# Patient Record
Sex: Female | Born: 1950 | Race: Black or African American | Hispanic: No | Marital: Single | Smoking: Never smoker
Health system: Southern US, Community
[De-identification: ages and names within clinical notes are randomized; demographics above are authoritative.]

## PROBLEM LIST (undated history)

## (undated) DIAGNOSIS — M810 Age-related osteoporosis without current pathological fracture: Secondary | ICD-10-CM

## (undated) DIAGNOSIS — Z923 Personal history of irradiation: Secondary | ICD-10-CM

## (undated) DIAGNOSIS — F419 Anxiety disorder, unspecified: Secondary | ICD-10-CM

## (undated) DIAGNOSIS — C50919 Malignant neoplasm of unspecified site of unspecified female breast: Secondary | ICD-10-CM

## (undated) DIAGNOSIS — E785 Hyperlipidemia, unspecified: Secondary | ICD-10-CM

## (undated) HISTORY — DX: Malignant neoplasm of unspecified site of unspecified female breast: C50.919

## (undated) HISTORY — DX: Hyperlipidemia, unspecified: E78.5

## (undated) HISTORY — DX: Age-related osteoporosis without current pathological fracture: M81.0

## (undated) HISTORY — PX: BREAST BIOPSY: SHX20

---

## 2001-04-15 ENCOUNTER — Inpatient Hospital Stay (HOSPITAL_COMMUNITY): Admission: AD | Admit: 2001-04-15 | Discharge: 2001-04-15 | Payer: Self-pay | Admitting: *Deleted

## 2003-05-02 ENCOUNTER — Other Ambulatory Visit: Payer: Self-pay

## 2004-06-14 ENCOUNTER — Ambulatory Visit: Payer: Self-pay

## 2004-06-20 ENCOUNTER — Ambulatory Visit: Payer: Self-pay

## 2007-01-06 ENCOUNTER — Ambulatory Visit: Payer: Self-pay

## 2007-07-31 ENCOUNTER — Emergency Department (HOSPITAL_COMMUNITY): Admission: EM | Admit: 2007-07-31 | Discharge: 2007-07-31 | Payer: Self-pay | Admitting: Family Medicine

## 2008-03-01 ENCOUNTER — Ambulatory Visit: Payer: Self-pay

## 2010-03-12 ENCOUNTER — Ambulatory Visit: Payer: Self-pay

## 2010-09-10 ENCOUNTER — Ambulatory Visit: Payer: Self-pay

## 2012-07-22 ENCOUNTER — Telehealth (HOSPITAL_COMMUNITY): Payer: Self-pay | Admitting: *Deleted

## 2012-07-22 NOTE — Telephone Encounter (Signed)
Telephoned patient at mobile # and left message to return call to [facility] 

## 2013-03-22 ENCOUNTER — Encounter (HOSPITAL_COMMUNITY): Payer: Self-pay | Admitting: Emergency Medicine

## 2013-03-22 ENCOUNTER — Emergency Department (HOSPITAL_COMMUNITY)
Admission: EM | Admit: 2013-03-22 | Discharge: 2013-03-22 | Disposition: A | Discharge status: Home / Self Care | Payer: Self-pay | Source: Home / Self Care

## 2013-03-22 DIAGNOSIS — J302 Other seasonal allergic rhinitis: Secondary | ICD-10-CM

## 2013-03-22 MED ORDER — FLUTICASONE PROPIONATE 50 MCG/ACT NA SUSP: 2.0000 | Freq: Two times a day (BID) | NASAL | Status: DC

## 2013-03-22 MED ORDER — FEXOFENADINE HCL 180 MG PO TABS: 180.0000 mg | ORAL_TABLET | Freq: Every day | ORAL | Status: DC

## 2013-03-22 NOTE — ED Notes (Signed)
C/o sinus issues which started Friday and progressively got worst.  OTC medication taken but no relief.

## 2013-03-22 NOTE — ED Provider Notes (Signed)
CSN: [id number]     Arrival date & time 03/22/13  1914 History   None    Chief Complaint  Patient presents with  . Facial Pain   (Consider location/radiation/quality/duration/timing/severity/associated sxs/prior Treatment) Patient is a 62 y.o. female presenting with URI. The history is provided by the patient.  URI Presenting symptoms: congestion and rhinorrhea   Presenting symptoms: no fever and no sore throat   Severity:  Mild Onset quality:  Gradual Duration:  4 days Progression:  Unchanged Chronicity:  New   History reviewed. No pertinent past medical history. No past surgical history on file. No family history on file. History  Substance Use Topics  . Smoking status: Not on file  . Smokeless tobacco: Not on file  . Alcohol Use: Not on file   OB History   Grav Para Term Preterm Abortions TAB SAB Ect Mult Living                 Review of Systems  Constitutional: Negative.  Negative for fever and chills.  HENT: Positive for congestion, nosebleeds and rhinorrhea. Negative for sore throat.   Respiratory: Negative.   Cardiovascular: Negative.     Allergies  Review of patient's allergies indicates no known allergies.  Home Medications  No current outpatient prescriptions on file. BP 147/87  Pulse 73  Temp(Src) 98.2 F (36.8 C) (Oral)  Resp 18  SpO2 99% Physical Exam  Nursing note and vitals reviewed. Constitutional: She is oriented to person, place, and time. She appears well-developed and well-nourished.  HENT:  Head: Normocephalic.  Right Ear: External ear normal.  Left Ear: External ear normal.  Nose: Mucosal edema and rhinorrhea present.  Mouth/Throat: Oropharynx is clear and moist.  Neck: Normal range of motion. Neck supple.  Cardiovascular: Normal rate, regular rhythm, normal heart sounds and intact distal pulses.   Pulmonary/Chest: Effort normal and breath sounds normal.  Lymphadenopathy:    She has no cervical adenopathy.  Neurological: She is  alert and oriented to person, place, and time.  Skin: Skin is warm and dry.    ED Course  Procedures (including critical care time) Labs Review Labs Reviewed - No data to display Imaging Review No results found.    MDM      Linna Hoff, MD 03/22/13 2044

## 2013-10-12 ENCOUNTER — Ambulatory Visit: Payer: Self-pay | Admitting: Family Medicine

## 2014-11-15 ENCOUNTER — Ambulatory Visit: Payer: Self-pay | Attending: Oncology

## 2014-11-15 ENCOUNTER — Ambulatory Visit
Admission: RE | Admit: 2014-11-15 | Discharge: 2014-11-15 | Disposition: A | Discharge status: Home / Self Care | Payer: Self-pay | Source: Ambulatory Visit | Attending: Oncology | Admitting: Oncology

## 2014-11-15 VITALS — BP 108/68 | HR 80 | Temp 96.0°F | Resp 20 | Ht 66.54 in | Wt 127.9 lb

## 2014-11-15 DIAGNOSIS — Z0000 Encounter for general adult medical examination without abnormal findings: Secondary | ICD-10-CM

## 2014-11-15 NOTE — Progress Notes (Signed)
Subjective:     Patient ID: Cheryl [name], female   DOB: Mar 04, 1951, 64 y.o.   MRN: [id number]  HPI   Review of Systems     Objective:   Physical Exam  Pulmonary/Chest: Right breast exhibits tenderness. Right breast exhibits no inverted nipple, no mass, no nipple discharge and no skin change. Left breast exhibits tenderness. Left breast exhibits no inverted nipple, no mass, no nipple discharge and no skin change. Breasts are symmetrical.  Bilateral generalized breast tenderness       Assessment:     Patient presents for [place] clinic visitPatient screened, and meets [facility] eligibility.  Patient does not have insurance, Medicare or Medicaid.  Handout given on Affordable Care Act.CBE unremarkable.  Patient reports generalized  tenderness bilateral on palpation. To try to limit caffeine to decrease breast tenderness. Instructed patient on breast self-exam using teach back method.      Plan:     Sent for bilateral screening mammogram.  Specimen collected for Pap.

## 2014-11-20 LAB — PAP LB AND HPV HIGH-RISK
HPV, high-risk: NEGATIVE
PAP Smear Comment: 0

## 2014-11-28 ENCOUNTER — Telehealth: Payer: Self-pay | Admitting: *Deleted

## 2014-11-28 NOTE — Progress Notes (Signed)
Patient notified of normal mammogram, and pap smear results.  Copy to [facility].

## 2014-12-27 NOTE — Telephone Encounter (Signed)
confirmed

## 2015-11-19 ENCOUNTER — Ambulatory Visit: Payer: Self-pay

## 2015-11-26 ENCOUNTER — Ambulatory Visit: Payer: Self-pay | Attending: Internal Medicine

## 2015-11-26 ENCOUNTER — Ambulatory Visit
Admission: RE | Admit: 2015-11-26 | Discharge: 2015-11-26 | Disposition: A | Discharge status: Home / Self Care | Payer: Self-pay | Source: Ambulatory Visit | Attending: Internal Medicine | Admitting: Internal Medicine

## 2015-11-26 VITALS — BP 138/70 | HR 72 | Temp 97.7°F | Resp 20

## 2015-11-26 DIAGNOSIS — Z0000 Encounter for general adult medical examination without abnormal findings: Secondary | ICD-10-CM

## 2015-11-26 NOTE — Progress Notes (Signed)
Subjective:     Patient ID: Cheryl [name], female   DOB: [birth year], 65 y.o.   MRN: [id number]  HPI   Review of Systems     Objective:   Physical Exam  Pulmonary/Chest: Right breast exhibits no inverted nipple, no mass, no nipple discharge, no skin change and no tenderness. Left breast exhibits no inverted nipple, no mass, no nipple discharge, no skin change and no tenderness. Breasts are symmetrical.       Assessment:     65 year old patient presents for [facility] clinic visit.  Patient screened, and meets [facility] eligibility.  Patient does not have insurance, Medicare or Medicaid.  Handout given on Affordable Care Act.  Instructed patient on breast self-exam using teach back method.  CBE unremarkable.  No mass or lump palpated.   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    Plan:     Sent for bilateral screening mammogram.

## 2015-12-10 NOTE — Progress Notes (Unsigned)
Letter mailed from [facility] to notify of normal mammogram results.  Patient to return in one year for annual screening.  Copy to [facility]. 

## 2017-02-18 ENCOUNTER — Ambulatory Visit: Payer: Self-pay | Attending: Oncology

## 2019-03-19 ENCOUNTER — Encounter: Payer: Self-pay | Admitting: Emergency Medicine

## 2019-03-19 ENCOUNTER — Ambulatory Visit
Admission: EM | Admit: 2019-03-19 | Discharge: 2019-03-19 | Disposition: A | Discharge status: Home / Self Care | Payer: Medicare Other | Attending: Family Medicine | Admitting: Family Medicine

## 2019-03-19 ENCOUNTER — Other Ambulatory Visit: Payer: Self-pay

## 2019-03-19 DIAGNOSIS — R202 Paresthesia of skin: Secondary | ICD-10-CM | POA: Diagnosis not present

## 2019-03-19 DIAGNOSIS — S161XXA Strain of muscle, fascia and tendon at neck level, initial encounter: Secondary | ICD-10-CM | POA: Diagnosis present

## 2019-03-19 DIAGNOSIS — M79602 Pain in left arm: Secondary | ICD-10-CM

## 2019-03-19 MED ORDER — CYCLOBENZAPRINE HCL 10 MG PO TABS: 10.0000 mg | ORAL_TABLET | Freq: Every day | ORAL | 0 refills | Status: DC

## 2019-03-19 NOTE — ED Provider Notes (Signed)
[facility]    CSN: [id number] Arrival date & time: 03/19/19  1512      History   Chief Complaint Chief Complaint  Patient presents with  . Extremity Weakness    HPI Cheryl [name] is a 68 y.o. female.   68 yo female with a c/o left arm pain and tingling for the past 2 weeks. Denies any falls or other injuries. Also c/o left upper back and neck pains. States symptoms are intermittent. Denies any chest pain, shortness of breath, vision changes, slurred speech, difficulty swallowing, jaw pain, unilateral weakness, headaches, rash. States she's been taking tylenol and advil with some relief.    Extremity Weakness    No past medical history on file.  There are no active problems to display for this patient.   No past surgical history on file.  OB History   No obstetric history on file.      Home Medications    Prior to Admission medications   Medication Sig Start Date End Date Taking? Authorizing Provider  cyclobenzaprine (FLEXERIL) 10 MG tablet Take 1 tablet (10 mg total) by mouth at bedtime. 03/19/19   Norval Gable, MD  fexofenadine (ALLEGRA) 180 MG tablet Take 1 tablet (180 mg total) by mouth daily. 03/22/13   Billy Fischer, MD  fluticasone (FLONASE) 50 MCG/ACT nasal spray Place 2 sprays into the nose 2 (two) times daily. 03/22/13   Billy Fischer, MD    Family History Family History  Problem Relation Age of Onset  . Breast cancer Mother 80  . Breast cancer Maternal Aunt 70  . Breast cancer Maternal Aunt 70    Social History Social History   Tobacco Use  . Smoking status: Not on file  Substance Use Topics  . Alcohol use: Not on file  . Drug use: Not on file     Allergies   Patient has no known allergies.   Review of Systems Review of Systems  Musculoskeletal: Positive for extremity weakness.     Physical Exam Triage Vital Signs ED Triage Vitals  Enc Vitals Group     BP 03/19/19 1531 (!) 178/93     Pulse Rate 03/19/19  1531 71     Resp 03/19/19 1531 18     Temp 03/19/19 1531 97.8 F (36.6 C)     Temp Source 03/19/19 1531 Oral     SpO2 03/19/19 1531 99 %     Weight 03/19/19 1529 128 lb (58.1 kg)     Height --      Head Circumference --      Peak Flow --      Pain Score 03/19/19 1526 5     Pain Loc --      Pain Edu? --      Excl. in [place]? --    No data found.  Updated Vital Signs BP (!) 178/93   Pulse 71   Temp 97.8 F (36.6 C) (Oral)   Resp 18   Wt 58.1 kg   SpO2 99%   BMI 20.33 kg/m   Visual Acuity Right Eye Distance:   Left Eye Distance:   Bilateral Distance:    Right Eye Near:   Left Eye Near:    Bilateral Near:     Physical Exam Vitals signs and nursing note reviewed.  Constitutional:      General: She is not in acute distress.    Appearance: She is not toxic-appearing or diaphoretic.  Eyes:     Extraocular  Movements: Extraocular movements intact.     Pupils: Pupils are equal, round, and reactive to light.  Cardiovascular:     Rate and Rhythm: Normal rate and regular rhythm.     Pulses: Normal pulses.     Heart sounds: Normal heart sounds.  Pulmonary:     Effort: Pulmonary effort is normal. No respiratory distress.     Breath sounds: Normal breath sounds. No stridor. No wheezing, rhonchi or rales.  Musculoskeletal:     Left shoulder: She exhibits tenderness (over the deltoid muscle). She exhibits normal range of motion, no bony tenderness, no swelling, no effusion, no crepitus, no deformity, no laceration, no spasm, normal pulse and normal strength.     Cervical back: She exhibits tenderness (over the left trapezius and cervical paraspinous muscles) and spasm. She exhibits normal range of motion, no bony tenderness, no swelling, no edema, no deformity, no laceration and normal pulse.     Left upper arm: She exhibits tenderness (over the deltoid muscle and inner upper arm; reproducible; no skin lesions, bruising or deformity). She exhibits no bony tenderness, no swelling, no  edema, no deformity and no laceration.     Comments: Left arm neurovascularly intact  Neurological:     General: No focal deficit present.     Mental Status: She is alert and oriented to person, place, and time.     Cranial Nerves: No cranial nerve deficit.     Sensory: No sensory deficit.     Motor: No weakness.     Coordination: Coordination normal.     Gait: Gait normal.     Deep Tendon Reflexes: Reflexes normal.      UC Treatments / Results  Labs (all labs ordered are listed, but only abnormal results are displayed) Labs Reviewed - No data to display  EKG   Radiology No results found.  Procedures ED EKG  Date/Time: 03/19/2019 4:10 PM Performed by: Norval Gable, MD Authorized by: Marylene Land, NP   ECG reviewed by ED Physician in the absence of a cardiologist: yes   Previous ECG:    Previous ECG:  Compared to current   Comparison ECG info:  2004; T wave inversions present; only change now is occasional PVC   Similarity:  Changes noted Interpretation:    Interpretation: normal   Rate:    ECG rate:  79   ECG rate assessment: normal   Rhythm:    Rhythm: sinus rhythm   Ectopy:    Ectopy: PVCs     PVCs:  Infrequent QRS:    QRS axis:  Normal   QRS intervals:  Normal Conduction:    Conduction: normal   ST segments:    ST segments:  Normal T waves:    T waves: inverted     Inverted:  V2, V3, V4 and V5   (including critical care time)  Medications Ordered in UC Medications - No data to display  Initial Impression / Assessment and Plan / UC Course  I have reviewed the triage vital signs and the nursing notes.  Pertinent labs & imaging results that were available during my care of the patient were reviewed by me and considered in my medical decision making (see chart for details).      Final Clinical Impressions(s) / UC Diagnoses   Final diagnoses:  Strain of neck muscle, initial encounter  Paresthesia of left arm     Discharge Instructions      Rest, heat, over the counter tylenol/advil as needed  ED Prescriptions    Medication Sig Dispense Auth. Provider   cyclobenzaprine (FLEXERIL) 10 MG tablet Take 1 tablet (10 mg total) by mouth at bedtime. 30 tablet Tamorah Hada, Linward Foster, MD     1. ekg results and diagnosis reviewed with patient 2. rx as per orders above; reviewed possible side effects, interactions, risks and benefits  3. Recommend supportive treatment as above 4. Follow up with PCP 5. Follow-up prn if symptoms worsen or don't improve  PDMP not reviewed this encounter.   Norval Gable, MD 03/19/19 [phone]

## 2019-03-19 NOTE — Discharge Instructions (Signed)
Rest, heat, over the counter tylenol/advil as needed

## 2019-03-19 NOTE — ED Triage Notes (Signed)
Patient in office today c/o left arm pain/tingling in hand x 2wk. Patient has upcoming Phys on 03/28/19  Denies: sweating/n/v/fever/blurred vision/HA  OTC: Ibu/tylenol

## 2019-04-12 ENCOUNTER — Other Ambulatory Visit: Payer: Self-pay | Admitting: Obstetrics and Gynecology

## 2019-04-12 DIAGNOSIS — Z1231 Encounter for screening mammogram for malignant neoplasm of breast: Secondary | ICD-10-CM

## 2019-04-19 LAB — HM DEXA SCAN

## 2019-04-21 LAB — HM MAMMOGRAPHY

## 2019-06-15 ENCOUNTER — Ambulatory Visit: Payer: Self-pay | Admitting: Urology

## 2019-06-17 ENCOUNTER — Ambulatory Visit (INDEPENDENT_AMBULATORY_CARE_PROVIDER_SITE_OTHER): Payer: Medicare Other | Admitting: Family Medicine

## 2019-06-17 ENCOUNTER — Encounter: Payer: Self-pay | Admitting: Family Medicine

## 2019-06-17 ENCOUNTER — Other Ambulatory Visit: Payer: Self-pay

## 2019-06-17 VITALS — BP 136/78 | HR 100 | Temp 96.8°F | Resp 12 | Ht 67.0 in | Wt 141.4 lb

## 2019-06-17 DIAGNOSIS — Z803 Family history of malignant neoplasm of breast: Secondary | ICD-10-CM

## 2019-06-17 DIAGNOSIS — I10 Essential (primary) hypertension: Secondary | ICD-10-CM

## 2019-06-17 DIAGNOSIS — E785 Hyperlipidemia, unspecified: Secondary | ICD-10-CM

## 2019-06-17 DIAGNOSIS — M2540 Effusion, unspecified joint: Secondary | ICD-10-CM

## 2019-06-17 DIAGNOSIS — M810 Age-related osteoporosis without current pathological fracture: Secondary | ICD-10-CM | POA: Diagnosis not present

## 2019-06-17 DIAGNOSIS — R300 Dysuria: Secondary | ICD-10-CM | POA: Diagnosis not present

## 2019-06-17 DIAGNOSIS — R319 Hematuria, unspecified: Secondary | ICD-10-CM | POA: Diagnosis not present

## 2019-06-17 DIAGNOSIS — M25512 Pain in left shoulder: Secondary | ICD-10-CM | POA: Diagnosis not present

## 2019-06-17 DIAGNOSIS — E559 Vitamin D deficiency, unspecified: Secondary | ICD-10-CM

## 2019-06-17 DIAGNOSIS — Z7689 Persons encountering health services in other specified circumstances: Secondary | ICD-10-CM

## 2019-06-17 DIAGNOSIS — Z1211 Encounter for screening for malignant neoplasm of colon: Secondary | ICD-10-CM

## 2019-06-17 DIAGNOSIS — R350 Frequency of micturition: Secondary | ICD-10-CM | POA: Diagnosis not present

## 2019-06-17 DIAGNOSIS — Z5181 Encounter for therapeutic drug level monitoring: Secondary | ICD-10-CM

## 2019-06-17 LAB — POCT URINALYSIS DIPSTICK
Bilirubin, UA: NEGATIVE
Blood, UA: POSITIVE
Glucose, UA: NEGATIVE
Ketones, UA: NEGATIVE
Nitrite, UA: NEGATIVE
Protein, UA: POSITIVE — AB
Spec Grav, UA: 1.025 (ref 1.010–1.025)
Urobilinogen, UA: 0.2 E.U./dL
pH, UA: 6.5 (ref 5.0–8.0)

## 2019-06-17 NOTE — Progress Notes (Signed)
Name: Cheryl [name]   MRN: [id number]    DOB: [birth year]   Date:06/17/2019       Progress Note  Chief Complaint  Patient presents with  . Establish Care  . Ear Pain    left onset 2 weeks with ringing  . Arm Pain    left onset since summer, pt states did a bone density and she has osteoprosis, has tried meloxicam with no help. can on left so high     Subjective:   Cheryl [name] is a 69 y.o. female, presents to clinic to establish care and she complains of left shoulder pain  Left shoulder pain started about 6 months ago it got better then started bother her a few weeks ago. She is LHD female, she denies any repetitive motion with her left shoulder other than tasks around her home.  She pulls out a medicine out of her bag which is Fosamax and states that she has been taking this medicine which was given to her by another provider and has not been helping with her shoulder. She states that she went to walk-in clinic multiple times for her left shoulder and they did x-rays of multiple things - reviewed records through care everywhere and initial visit for left shoulder pain was March 29, 2019 when x-ray was done which was unremarkable, she followed up about a month later in November when she says she thinks she spasm with her arm and it napkin in the back of her neck with hair clippers.  The abrasion to her neck was assessed but there is no further follow-up on her shoulder pain and patient has not been seen elsewhere for it. She denies any past injury or strain.  She is having trouble lifting her arm up, pain is waking her from her sleep.  She denies any numbness or tingling to her arm  Osteoporosis on Fosamax, new medication Patient seems to be unaware of utility for medication what it is treating and from what I can tell from chart review today patient was sent for bone density scan there is no recent labs for several years and most of her primary care has been done through [facility]YN doing  [facility]. She reports that she went through menopause around 69 y/o, and she has never taken any calcium or vitamin D supplements She is compliant with medication she denies any dysphagia  Recently had hematuria over the past 2 to 3 months-has been referred to urology for further evaluation (from GYN).  She endorses urinary frequency hematuria and some intermittent dysuria.  In November she was treated for UTI with Bactrim, she had hematuria with a negative culture prior to November, she continues to have some symptoms.  Referral and testing was done through a telephone encounter so she has not been examined for hematuria in the past few months.  Patient also per chart review has a history of elevated cholesterol -she denies being on medications to manage cholesterol in the past.  Only other available records are from the past several months, she is up-to-date on her mammograms   Patient Active Problem List   Diagnosis Date Noted  . Hyperlipidemia 06/21/2019  . Vitamin D deficiency 06/21/2019  . Hematuria 06/21/2019  . Osteoporosis without current pathological fracture 06/17/2019  . Left shoulder pain 06/17/2019    History reviewed. No pertinent surgical history.  Family History  Problem Relation Age of Onset  . Breast cancer Mother 62  . Breast cancer Maternal Aunt 70  .  Breast cancer Maternal Aunt 70  . Lung cancer Father   . Stroke Brother     Social History   Socioeconomic History  . Marital status: Single    Spouse name: Not on file  . Number of children: 1  . Years of education: 38  . Highest education level: Not on file  Occupational History  . Occupation: unemployed  Tobacco Use  . Smoking status: Never Smoker  . Smokeless tobacco: Never Used  Substance and Sexual Activity  . Alcohol use: Never  . Drug use: Never  . Sexual activity: Yes  Other Topics Concern  . Not on file  Social History Narrative  . Not on file   Social Determinants of Health    Financial Resource Strain:   . Difficulty of Paying Living Expenses: Not on file  Food Insecurity:   . Worried About Charity fundraiser in the Last Year: Not on file  . Ran Out of Food in the Last Year: Not on file  Transportation Needs:   . Lack of Transportation (Medical): Not on file  . Lack of Transportation (Non-Medical): Not on file  Physical Activity:   . Days of Exercise per Week: Not on file  . Minutes of Exercise per Session: Not on file  Stress:   . Feeling of Stress : Not on file  Social Connections:   . Frequency of Communication with Friends and Family: Not on file  . Frequency of Social Gatherings with Friends and Family: Not on file  . Attends Religious Services: Not on file  . Active Member of Clubs or Organizations: Not on file  . Attends Archivist Meetings: Not on file  . Marital Status: Not on file  Intimate Partner Violence:   . Fear of Current or Ex-Partner: Not on file  . Emotionally Abused: Not on file  . Physically Abused: Not on file  . Sexually Abused: Not on file     Current Outpatient Medications:  .  alendronate (FOSAMAX) 70 MG tablet, Take 70 mg by mouth once a week., Disp: , Rfl:   No Known Allergies  Chart Review Today: I personally reviewed active problem list, medication list, allergies, family history, social history, health maintenance, notes from last encounter, lab results, imaging with the patient/caregiver today.  Review of Systems  Constitutional: Negative.   HENT: Negative.   Eyes: Negative.   Respiratory: Negative.   Cardiovascular: Negative.   Gastrointestinal: Negative.   Endocrine: Negative.   Genitourinary: Negative.   Musculoskeletal: Negative.   Skin: Negative.   Allergic/Immunologic: Negative.   Neurological: Negative.   Hematological: Negative.   Psychiatric/Behavioral: Negative.   All other systems reviewed and are negative.    Objective:    Vitals:   06/17/19 1100  BP: 136/78  Pulse: 100   Resp: 12  Temp: (!) 96.8 F (36 C)  SpO2: 98%  Weight: 141 lb 6.4 oz (64.1 kg)  Height: 5\' 7"  (1.702 m)    Body mass index is 22.15 kg/m.  Physical Exam Vitals and nursing note reviewed.  Constitutional:      General: She is not in acute distress.    Appearance: Normal appearance. She is well-developed. She is not ill-appearing, toxic-appearing or diaphoretic.     Interventions: Face mask in place.  HENT:     Head: Normocephalic and atraumatic.     Right Ear: External ear normal.     Left Ear: External ear normal.  Eyes:     General: Lids are  normal. No scleral icterus.       Right eye: No discharge.        Left eye: No discharge.     Conjunctiva/sclera: Conjunctivae normal.  Neck:     Trachea: Phonation normal. No tracheal deviation.  Cardiovascular:     Rate and Rhythm: Normal rate and regular rhythm.     Pulses: Normal pulses.          Radial pulses are 2+ on the right side and 2+ on the left side.       Posterior tibial pulses are 2+ on the right side and 2+ on the left side.     Heart sounds: Normal heart sounds. No murmur. No friction rub. No gallop.   Pulmonary:     Effort: Pulmonary effort is normal. No respiratory distress.     Breath sounds: Normal breath sounds. No stridor. No wheezing, rhonchi or rales.  Chest:     Chest wall: No tenderness.  Abdominal:     General: Bowel sounds are normal. There is no distension.     Palpations: Abdomen is soft.     Tenderness: There is no abdominal tenderness. There is no guarding or rebound.  Musculoskeletal:        General: No deformity.     Left shoulder: Tenderness present. No swelling, deformity, effusion or laceration. Decreased range of motion. Normal strength. Normal pulse.     Cervical back: Normal range of motion and neck supple.     Right lower leg: No edema.     Left lower leg: No edema.     Comments: L shoulder decreased extension and abduction, cannot raise shoulder to 90 degrees, pain with passive range  of motion, positive Neer's Tenderness to palpation to biceps groove and lateral glenohumeral fossa No sternoclavicular joint tenderness, clavicular tenderness, AC joint tenderness Good grip strength and normal sensation to light touch to bilateral upper extremities  Lymphadenopathy:     Cervical: No cervical adenopathy.  Skin:    General: Skin is warm and dry.     Capillary Refill: Capillary refill takes less than 2 seconds.     Coloration: Skin is not jaundiced or pale.     Findings: No rash.  Neurological:     Mental Status: She is alert and oriented to person, place, and time.     Motor: No abnormal muscle tone.     Gait: Gait normal.  Psychiatric:        Speech: Speech normal.        Behavior: Behavior normal.        PHQ2/9: Depression screen PHQ 2/9 06/17/2019  Decreased Interest 0  Down, Depressed, Hopeless 0  PHQ - 2 Score 0  Altered sleeping 0  Tired, decreased energy 0  Change in appetite 0  Feeling bad or failure about yourself  0  Trouble concentrating 0  Moving slowly or fidgety/restless 0  Suicidal thoughts 0  PHQ-9 Score 0  Difficult doing work/chores Not difficult at all    phq 9 is negative, reviewed and discussed with pt today  Fall Risk: Fall Risk  06/17/2019  Falls in the past year? 0  Number falls in past yr: 0  Injury with Fall? 0    Functional Status Survey: Is the patient deaf or have difficulty hearing?: No Does the patient have difficulty seeing, even when wearing glasses/contacts?: No Does the patient have difficulty concentrating, remembering, or making decisions?: No Does the patient have difficulty walking or climbing stairs?: No Does  the patient have difficulty dressing or bathing?: No Does the patient have difficulty doing errands alone such as visiting a doctor's office or shopping?: No   Assessment & Plan:     ICD-10-CM   1. Left shoulder pain, unspecified chronicity  M25.512 Ambulatory referral to Orthopedic Surgery    CMP w  GFR   at least 4+ months of shoulder pain LHD female, she has decreased range of motion,strongly recommend Ortho and PT  Suspect impingement Patient was able to do drop test and empty can test - do not suspect rotator cuff tear Likely some underlying osteoarthritis and inflammation or possibly bone spurs etc. causing worsening symptoms gradually over the past several months    2. Joint swelling  M25.40 CBC w/ Diff   she states L shoulder is swollen, I do not appreciate any swelling today, some asymmetry due to compensation for decreased shoulder ROM   3. Osteoporosis without current pathological fracture, unspecified osteoporosis type  M81.0 CMP w GFR    Vit D   Per DEXA scan available in care everywhere, on Fosamax will check labs vitamin D and calcium not on supplement for the past 24 years since early menopause   4. Hematuria, unspecified type  R31.9 CMP w GFR  Hx of several months of hematuria, recheck today, previously referred to urology  POCT UA dip    Urine Culture  5. Dysuria  R30.0 CMP w GFR    CBC w/ Diff    POCT UA dip    Urine Culture  6. Urinary frequency  R35.0 CMP w GFR  R/o infection, DM, Ddx includes IC, OAB   CBC w/ Diff    A1C    POCT UA dip  7. Hypertension, unspecified type  I10 CMP w GFR   History of, blood pressure at goal today without medications   8. Hyperlipidemia, unspecified hyperlipidemia type  E78.5 CMP w GFR    Lipid Panel   hx of HLD, discusssed cholesterol, cardiovascular risk and medications with treat and lower risk, check labs today   9. Vitamin D deficiency  E55.9 Vit D   Labs pending, supplement per results   10. Family history of breast cancer in first degree relative   Z80.3 UTD on her mammogram screening  11. Encounter to establish care with new doctor  Z76.89    reviewed available records in EMR and through care everywhere   12. Screening for malignant neoplasm of colon  Z12.11    Overdue for colonoscopy   13. Encounter for  medication monitoring  Z51.81 CMP w GFR    Vit D    CBC w/ Diff   Monitor calcium and vitamin D with Fosamax for osteoporosis       Delsa Grana, PA-C 06/17/19 11:30 AM

## 2019-06-17 NOTE — Patient Instructions (Addendum)
You will follow up with orthopedist - you should get a call for an appointment   See info below about osteoporosis, which you have and are treating with your pills - fosamax  Start supplementing Vit D3 and calcium over the counter 1,200 mg of calcium every day Vit D 1000 IU   Preventing Osteoporosis, Adult Osteoporosis is a condition that causes the bones to lose density. This means that the bones become thinner, and the normal spaces in bone tissue become larger. Low bone density can make the bones weak and cause them to break more easily. Osteoporosis cannot always be prevented, but you can take steps to lower your risk of developing this condition. How can this condition affect me? If you develop osteoporosis, you will be more likely to break bones in your wrist, spine, or hip. Even a minor accident or injury can be enough to break weak bones. The bones will also be slower to heal. Osteoporosis can cause other problems as well, such as a stooped posture or trouble with movement. Osteoporosis can occur with aging. As you get older, you may lose bone tissue more quickly, or it may be replaced more slowly. Osteoporosis is more likely to develop if you have poor nutrition or do not get enough calcium or vitamin D. Other lifestyle factors can also play a role. By eating a well-balanced diet and making lifestyle changes, you can help keep your bones strong and healthy, lowering your chances of developing osteoporosis. What can increase my risk? The following factors may make you more likely to develop osteoporosis:  Having a family history of the condition.  Having poor nutrition or not getting enough calcium or vitamin D.  Using certain medicines, such as steroid medicines or antiseizure medicines.  Being any of the following: ? 39 years of age or older. ? Female. ? A woman who has gone through menopause (is postmenopausal). ? White (Caucasian) or of Asian descent.  Smoking or having a  history of smoking.  Not being physically active (being sedentary).  Having a small body frame. What actions can I take to prevent this?  Get enough calcium   Make sure you get enough calcium every day. Calcium is the most important mineral for bone health. Most people can get enough calcium from their diet, but supplements may be recommended for people who are at risk for osteoporosis. Follow these guidelines: ? If you are age 35 or younger, aim to get 1,000 mg of calcium every day. ? If you are older than age 29, aim to get 1,200 mg of calcium every day.  Good sources of calcium include: ? Dairy products, such as low-fat or nonfat milk, cheese, and yogurt. ? Dark green leafy vegetables, such as bok choy and broccoli. ? Foods that have had calcium added to them (calcium-fortified foods), such as orange juice, cereal, bread, soy beverages, and tofu products. ? Nuts, such as almonds.  Check nutrition labels to see how much calcium is in a food or drink. Get enough vitamin D  Try to get enough vitamin D every day. Vitamin D is the most essential vitamin for bone health. It helps the body absorb calcium. Follow these guidelines for how much vitamin D to get from food: ? If you are age 66 or younger, aim to get at least 600 international units (IU) every day. Your health care provider may suggest more. ? If you are older than age 25, aim to get at least 800 international units every  day. Your health care provider may suggest more.  Good sources of vitamin D in your diet include: ? Egg yolks. ? Oily fish, such as salmon, sardines, and tuna. ? Milk and cereal fortified with vitamin D.  Your body also makes vitamin D when you are out in the sun. Exposing the bare skin on your face, arms, legs, or back to the sun for no more than 30 minutes a day, 2 times a week is more than enough. Beyond that, make sure you use sunblock to protect your skin from sunburn, which increases your risk for skin  cancer. Exercise  Stay active and get exercise every day.  Ask your health care provider what types of exercise are best for you. Weight-bearing and strength-building activities are important for building and maintaining healthy bones. Some examples of these types of activities include: ? Walking and hiking. ? Jogging and running. ? Dancing. ? Gym exercises. ? Lifting weights. ? Tennis and racquetball. ? Climbing stairs. ? Aerobics. Make other lifestyle changes  Do not use any products that contain nicotine or tobacco, such as cigarettes, e-cigarettes, and chewing tobacco. If you need help quitting, ask your health care provider.  Lose weight if you are overweight.  If you drink alcohol: ? Limit how much you use to:  0-1 drink a day for nonpregnant women.  0-2 drinks a day for men. ? Be aware of how much alcohol is in your drink. In the [place] one drink equals one 12 oz bottle of beer (355 mL), one 5 oz glass of wine (148 mL), or one 1 oz glass of hard liquor (44 mL). Where to find support If you need help making changes to prevent osteoporosis, talk with your health care provider. You can ask for a referral to a diet and nutrition specialist (dietitian) and a physical therapist. Where to find more information Learn more about osteoporosis from:  NIH Osteoporosis and Related [place]: www.bones.SouthExposed.es  [place] Office on [employer] Health: [place]Signs.tn  [place]: EquipmentWeekly.com.ee Summary  Osteoporosis is a condition that causes weak bones that are more likely to break.  Eat a healthy diet, making sure you get enough calcium and vitamin D, and stay active by getting regular exercise to help prevent osteoporosis.  Other ways to reduce your risk of osteoporosis include maintaining a healthy weight and avoiding alcohol and products that contain nicotine or tobacco. This information is not intended to replace advice given to  you by your health care provider. Make sure you discuss any questions you have with your health care provider. Document Revised: 12/17/2018 Document Reviewed: 12/17/2018 Elsevier Patient Education  [place].   Osteoporosis  Osteoporosis happens when your bones get thin and weak. This can cause your bones to break (fracture) more easily. You can do things at home to make your bones stronger. Follow these instructions at home:  Activity  Exercise as told by your doctor. Ask your doctor what activities are safe for you. You should do: ? Exercises that make your muscles work to hold your body weight up (weight-bearing exercises). These include tai chi, yoga, and walking. ? Exercises to make your muscles stronger. One example is lifting weights. Lifestyle  Limit alcohol intake to no more than 1 drink a day for nonpregnant women and 2 drinks a day for men. One drink equals 12 oz of beer, 5 oz of wine, or 1 oz of hard liquor.  Do not use any products that have nicotine  or tobacco in them. These include cigarettes and e-cigarettes. If you need help quitting, ask your doctor. Preventing falls  Use tools to help you move around (mobility aids) as needed. These include canes, walkers, scooters, and crutches.  Keep rooms well-lit and free of clutter.  Put away things that could make you trip. These include cords and rugs.  Install safety rails on stairs. Install grab bars in bathrooms.  Use rubber mats in slippery areas, like bathrooms.  Wear shoes that: ? Fit you well. ? Support your feet. ? Have closed toes. ? Have rubber soles or low heels.  Tell your doctor about all of the medicines you are taking. Some medicines can make you more likely to fall. General instructions  Eat plenty of calcium and vitamin D. These nutrients are good for your bones. Good sources of calcium and vitamin D include: ? Some fatty fish, such as salmon and tuna. ? Foods that have calcium and  vitamin D added to them (fortified foods). For example, some breakfast cereals are fortified with calcium and vitamin D. ? Egg yolks. ? Cheese. ? Liver.  Take over-the-counter and prescription medicines only as told by your doctor.  Keep all follow-up visits as told by your doctor. This is important. Contact a doctor if:  You have not been tested (screened) for osteoporosis and you are: ? A woman who is age 41 or older. ? A man who is age 60 or older. Get help right away if:  You fall.  You get hurt. Summary  Osteoporosis happens when your bones get thin and weak.  Weak bones can break (fracture) more easily.  Eat plenty of calcium and vitamin D. These nutrients are good for your bones.  Tell your doctor about all of the medicines that you take. This information is not intended to replace advice given to you by your health care provider. Make sure you discuss any questions you have with your health care provider. Document Revised: 05/01/2017 Document Reviewed: 03/13/2017 Elsevier Patient Education  2020 [employer].

## 2019-06-18 LAB — COMPLETE METABOLIC PANEL WITH GFR
AG Ratio: 1.2 (calc) (ref 1.0–2.5)
ALT: 9 U/L (ref 6–29)
AST: 15 U/L (ref 10–35)
Albumin: 3.9 g/dL (ref 3.6–5.1)
Alkaline phosphatase (APISO): 76 U/L (ref 37–153)
BUN: 20 mg/dL (ref 7–25)
CO2: 27 mmol/L (ref 20–32)
Calcium: 9.3 mg/dL (ref 8.6–10.4)
Chloride: 105 mmol/L (ref 98–110)
Creat: 0.91 mg/dL (ref 0.50–0.99)
GFR, Est African American: 76 mL/min/{1.73_m2} (ref >=60)
GFR, Est Non African American: 66 mL/min/{1.73_m2} (ref >=60)
Globulin: 3.3 g/dL (calc) (ref 1.9–3.7)
Glucose, Bld: 84 mg/dL (ref 65–99)
Potassium: 4.8 mmol/L (ref 3.5–5.3)
Sodium: 138 mmol/L (ref 135–146)
Total Bilirubin: 0.3 mg/dL (ref 0.2–1.2)
Total Protein: 7.2 g/dL (ref 6.1–8.1)

## 2019-06-18 LAB — CBC WITH DIFFERENTIAL/PLATELET
Absolute Monocytes: 535 cells/uL (ref 200–950)
Basophils Absolute: 48 cells/uL (ref 0–200)
Basophils Relative: 0.9 %
Eosinophils Absolute: 101 cells/uL (ref 15–500)
Eosinophils Relative: 1.9 %
HCT: 36.8 % (ref 35.0–45.0)
Hemoglobin: 11.6 g/dL — ABNORMAL LOW (ref 11.7–15.5)
Lymphs Abs: 1860 cells/uL (ref 850–3900)
MCH: 27.6 pg (ref 27.0–33.0)
MCHC: 31.5 g/dL — ABNORMAL LOW (ref 32.0–36.0)
MCV: 87.4 fL (ref 80.0–100.0)
MPV: 9.6 fL (ref 7.5–12.5)
Monocytes Relative: 10.1 %
Neutro Abs: 2756 cells/uL (ref 1500–7800)
Neutrophils Relative %: 52 %
Platelets: 291 10*3/uL (ref 140–400)
RBC: 4.21 10*6/uL (ref 3.80–5.10)
RDW: 13.7 % (ref 11.0–15.0)
Total Lymphocyte: 35.1 %
WBC: 5.3 10*3/uL (ref 3.8–10.8)

## 2019-06-18 LAB — HEMOGLOBIN A1C
Hgb A1c MFr Bld: 5.6 % of total Hgb (ref <5.7)
Mean Plasma Glucose: 114 (calc)
eAG (mmol/L): 6.3 (calc)

## 2019-06-18 LAB — VITAMIN D 25 HYDROXY (VIT D DEFICIENCY, FRACTURES): Vit D, 25-Hydroxy: 9 ng/mL — ABNORMAL LOW (ref 30–100)

## 2019-06-18 LAB — URINE CULTURE
MICRO NUMBER:: 10047737
Result:: NO GROWTH
SPECIMEN QUALITY:: ADEQUATE

## 2019-06-18 LAB — LIPID PANEL
Cholesterol: 240 mg/dL — ABNORMAL HIGH (ref <200)
HDL: 55 mg/dL (ref >=50)
LDL Cholesterol (Calc): 162 mg/dL (calc) — ABNORMAL HIGH
Non-HDL Cholesterol (Calc): 185 mg/dL (calc) — ABNORMAL HIGH (ref <130)
Total CHOL/HDL Ratio: 4.4 (calc) (ref <5.0)
Triglycerides: 111 mg/dL (ref <150)

## 2019-06-20 ENCOUNTER — Other Ambulatory Visit: Payer: Self-pay | Admitting: Family Medicine

## 2019-06-20 DIAGNOSIS — E785 Hyperlipidemia, unspecified: Secondary | ICD-10-CM

## 2019-06-20 DIAGNOSIS — E559 Vitamin D deficiency, unspecified: Secondary | ICD-10-CM

## 2019-06-20 DIAGNOSIS — R319 Hematuria, unspecified: Secondary | ICD-10-CM

## 2019-06-20 MED ORDER — VITAMIN D (ERGOCALCIFEROL) 1.25 MG (50000 UNIT) PO CAPS: 50000.0000 [IU] | ORAL_CAPSULE | ORAL | 0 refills | Status: DC

## 2019-06-20 MED ORDER — ROSUVASTATIN CALCIUM 10 MG PO TABS: 10.0000 mg | ORAL_TABLET | Freq: Every day | ORAL | 3 refills | Status: DC

## 2019-06-21 ENCOUNTER — Encounter: Payer: Self-pay | Admitting: Family Medicine

## 2019-06-21 DIAGNOSIS — Z803 Family history of malignant neoplasm of breast: Secondary | ICD-10-CM | POA: Insufficient documentation

## 2019-06-21 DIAGNOSIS — E559 Vitamin D deficiency, unspecified: Secondary | ICD-10-CM | POA: Insufficient documentation

## 2019-06-21 DIAGNOSIS — R319 Hematuria, unspecified: Secondary | ICD-10-CM | POA: Insufficient documentation

## 2019-06-21 DIAGNOSIS — E785 Hyperlipidemia, unspecified: Secondary | ICD-10-CM | POA: Insufficient documentation

## 2019-06-22 ENCOUNTER — Other Ambulatory Visit: Payer: Self-pay

## 2019-06-22 DIAGNOSIS — E559 Vitamin D deficiency, unspecified: Secondary | ICD-10-CM

## 2019-06-22 DIAGNOSIS — E785 Hyperlipidemia, unspecified: Secondary | ICD-10-CM

## 2019-06-22 MED ORDER — ROSUVASTATIN CALCIUM 10 MG PO TABS: 10.0000 mg | ORAL_TABLET | Freq: Every day | ORAL | 3 refills | Status: DC

## 2019-06-22 MED ORDER — VITAMIN D (ERGOCALCIFEROL) 1.25 MG (50000 UNIT) PO CAPS: 50000.0000 [IU] | ORAL_CAPSULE | ORAL | 0 refills | Status: DC

## 2019-07-14 ENCOUNTER — Ambulatory Visit (INDEPENDENT_AMBULATORY_CARE_PROVIDER_SITE_OTHER): Payer: Medicare Other | Admitting: Urology

## 2019-07-14 ENCOUNTER — Other Ambulatory Visit: Payer: Self-pay

## 2019-07-14 ENCOUNTER — Encounter: Payer: Self-pay | Admitting: Urology

## 2019-07-14 VITALS — BP 150/84 | HR 72 | Ht 67.0 in | Wt 141.0 lb

## 2019-07-14 DIAGNOSIS — F17200 Nicotine dependence, unspecified, uncomplicated: Secondary | ICD-10-CM

## 2019-07-14 DIAGNOSIS — R3129 Other microscopic hematuria: Secondary | ICD-10-CM | POA: Diagnosis not present

## 2019-07-14 LAB — URINALYSIS, COMPLETE
Bilirubin, UA: NEGATIVE
Glucose, UA: NEGATIVE
Ketones, UA: NEGATIVE
Nitrite, UA: NEGATIVE
Protein,UA: NEGATIVE
Specific Gravity, UA: 1.015 (ref 1.005–1.030)
Urobilinogen, Ur: 0.2 mg/dL (ref 0.2–1.0)
pH, UA: 7 (ref 5.0–7.5)

## 2019-07-14 LAB — MICROSCOPIC EXAMINATION

## 2019-07-14 NOTE — Progress Notes (Signed)
07/14/2019 2:52 PM   KESLEY CHASSE 25-Dec-1950 [id number]  Referring provider: Francetta Found, [address] [place],  [place] [address]  Chief Complaint  Patient presents with  . Hematuria    HPI: 69 year old female referred for further evaluation of microscopic hematuria.  She was noted to have incidental microscopic hematuria in her urine on 04/12/2019 with 10-50 red blood cells per high-powered field without WBCs and rare bacteria.  Urine culture on this occasion was negative.  This was followed up with a repeat urinalysis on 04/26/2019 which showed a slightly contaminated appearing urine with squamous epithelials, similar amount of red blood cells but also presence of white blood cells.  This culture was also negative, grew small amount of mixed flora.  Given the more suspicious appearing nature of her second urinalysis, she was treated with Bactrim for 3 days.  She remains asymptomatic today.  She denies flank pain, gross hematuria, history of kidney stones or any other GU issues.   No urgency frequency or incontinence.  No recent cross-sectional imaging.  Current smoker, smokes socially off and on since a teenager.  PMH: Past Medical History:  Diagnosis Date  . Osteoporosis     Surgical History: No past surgical history on file.  Home Medications:  Allergies as of 07/14/2019   No Known Allergies     Medication List       Accurate as of July 14, 2019  2:52 PM. If you have any questions, ask your nurse or doctor.        alendronate 70 MG tablet Commonly known as: FOSAMAX Take 70 mg by mouth once a week.   cyclobenzaprine 10 MG tablet Commonly known as: FLEXERIL cyclobenzaprine 10 mg tablet  TAKE 1 TABLET BY MOUTH EVERYDAY AT BEDTIME   meloxicam 15 MG tablet Commonly known as: MOBIC meloxicam 15 mg tablet  TAKE 1 TABLET (15 MG TOTAL) BY MOUTH ONCE DAILY FOR 30 DOSES   rosuvastatin 10 MG tablet Commonly known as: Crestor Take 1  tablet (10 mg total) by mouth at bedtime.   Vitamin D (Ergocalciferol) 1.25 MG (50000 UNIT) Caps capsule Commonly known as: DRISDOL Take 1 capsule (50,000 Units total) by mouth every Wednesday and Saturday. x12 weeks.       Allergies: No Known Allergies  Family History: Family History  Problem Relation Age of Onset  . Breast cancer Mother 41  . Breast cancer Maternal Aunt 70  . Breast cancer Maternal Aunt 70  . Lung cancer Father   . Stroke Brother     Social History:  reports that she has never smoked. She has never used smokeless tobacco. She reports that she does not drink alcohol or use drugs.   Physical Exam: BP (!) 150/84   Pulse 72   Ht 5\' 7"  (1.702 m)   Wt 141 lb (64 kg)   BMI 22.08 kg/m   Constitutional:  Alert and oriented, No acute distress. HEENT: [place] AT, moist mucus membranes.  Trachea midline, no masses. Cardiovascular: No clubbing, cyanosis, or edema. Respiratory: Normal respiratory effort, no increased work of breathing. Skin: No rashes, bruises or suspicious lesions. Neurologic: Grossly intact, no focal deficits, moving all 4 extremities. Psychiatric: Normal mood and affect.  Laboratory Data: Lab Results  Component Value Date   WBC 5.3 06/17/2019   HGB 11.6 (L) 06/17/2019   HCT 36.8 06/17/2019   MCV 87.4 06/17/2019   PLT 291 06/17/2019    Lab Results  Component Value Date   CREATININE  0.91 06/17/2019    Lab Results  Component Value Date   HGBA1C 5.6 06/17/2019    Urinalysis Results for orders placed or performed in visit on 07/14/19  Microscopic Examination   URINE  Result Value Ref Range   WBC, UA 11-30 (A) 0 - 5 /hpf   RBC 3-10 (A) 0 - 2 /hpf   Epithelial Cells (non renal) 0-10 0 - 10 /hpf   Renal Epithel, UA 0-10 (A) None seen /hpf   Bacteria, UA Few None seen/Few  Urinalysis, Complete  Result Value Ref Range   Specific Gravity, UA 1.015 1.005 - 1.030   pH, UA 7.0 5.0 - 7.5   Color, UA Yellow Yellow   Appearance Ur Hazy (A)  Clear   Leukocytes,UA 2+ (A) Negative   Protein,UA Negative Negative/Trace   Glucose, UA Negative Negative   Ketones, UA Negative Negative   RBC, UA 1+ (A) Negative   Bilirubin, UA Negative Negative   Urobilinogen, Ur 0.2 0.2 - 1.0 mg/dL   Nitrite, UA Negative Negative   Microscopic Examination See below:     Pertinent Imaging: n/a  Assessment & Plan:    1. Microscopic hematuria We discussed the differential diagnosis for microscopic hematuria including nephrolithiasis, renal or upper tract tumors, bladder stones, UTIs, or bladder tumors as well as undetermined etiologies. Per AUA guidelines, I did recommend complete microscopic hematuria evaluation including CTU, possible urine cytology, and office cystoscopy.  Based on the guidance, she does fall into the high risk category based on age, degree of hematuria as well as smoking.  - Urinalysis, Complete - CT HEMATURIA WORKUP; Future  2. Smoker We discussed the causative relationship between bladder cancer and smoking  Urged cessation today.   Return in about 4 weeks (around 08/11/2019) for cysto, CT scan.  Hollice Espy, MD  [facility] [address] [place], [place] [address] [phone]

## 2019-07-14 NOTE — Patient Instructions (Signed)

## 2019-08-03 ENCOUNTER — Ambulatory Visit: Admission: RE | Admit: 2019-08-03 | Payer: Medicare Other | Source: Ambulatory Visit

## 2019-08-09 ENCOUNTER — Other Ambulatory Visit: Payer: Self-pay

## 2019-08-09 DIAGNOSIS — R3129 Other microscopic hematuria: Secondary | ICD-10-CM

## 2019-08-10 ENCOUNTER — Other Ambulatory Visit
Admission: RE | Admit: 2019-08-10 | Discharge: 2019-08-10 | Disposition: A | Discharge status: Home / Self Care | Payer: Medicare Other | Source: Home / Self Care | Attending: Urology | Admitting: Urology

## 2019-08-10 ENCOUNTER — Ambulatory Visit: Payer: Medicare Other

## 2019-08-10 ENCOUNTER — Ambulatory Visit
Admission: RE | Admit: 2019-08-10 | Discharge: 2019-08-10 | Disposition: A | Discharge status: Home / Self Care | Payer: Medicare Other | Source: Ambulatory Visit | Attending: Urology | Admitting: Urology

## 2019-08-10 ENCOUNTER — Other Ambulatory Visit: Payer: Self-pay

## 2019-08-10 DIAGNOSIS — R3129 Other microscopic hematuria: Secondary | ICD-10-CM | POA: Diagnosis present

## 2019-08-10 LAB — CREATININE, SERUM
Creatinine, Ser: 1.1 mg/dL — ABNORMAL HIGH (ref 0.44–1.00)
GFR calc Af Amer: >60 mL/min (ref >60)
GFR calc non Af Amer: 52 mL/min — ABNORMAL LOW (ref >60)

## 2019-08-10 MED ORDER — IOHEXOL 300 MG/ML  SOLN
150.0000 mL | Freq: Once | INTRAMUSCULAR | Status: AC | PRN
  Administered 2019-08-10: 125 mL via INTRAVENOUS

## 2019-08-11 ENCOUNTER — Other Ambulatory Visit: Payer: Self-pay | Admitting: Urology

## 2019-08-16 ENCOUNTER — Encounter: Payer: Self-pay | Admitting: Urology

## 2019-08-16 ENCOUNTER — Ambulatory Visit (INDEPENDENT_AMBULATORY_CARE_PROVIDER_SITE_OTHER): Payer: Medicare Other | Admitting: Urology

## 2019-08-16 ENCOUNTER — Other Ambulatory Visit: Payer: Medicare Other

## 2019-08-16 ENCOUNTER — Other Ambulatory Visit: Payer: Self-pay

## 2019-08-16 VITALS — BP 147/84 | HR 86 | Ht 67.0 in | Wt 141.0 lb

## 2019-08-16 DIAGNOSIS — R3129 Other microscopic hematuria: Secondary | ICD-10-CM | POA: Diagnosis not present

## 2019-08-16 NOTE — Addendum Note (Signed)
Addended by: Verlene Mayer A on: 08/16/2019 01:15 PM   Modules accepted: Orders

## 2019-08-16 NOTE — Progress Notes (Signed)
   08/16/19  CC:  Chief Complaint  Patient presents with  . Cysto    HPI: 69 year old female with microscopic hematuria who presents today for cystoscopy.  She underwent CT urogram in the interim which was unremarkable.  Please see report, scanned personally reviewed today and agree with radiologic interpretation.  No urinary symptoms today.  No gross hematuria.  Blood pressure (!) 147/84, pulse 86, height 5\' 7"  (1.702 m), weight 141 lb (64 kg). NED. A&Ox3.   No respiratory distress   Abd soft, NT, ND Normal external genitalia with patent urethral meatus  Cystoscopy Procedure Note  Patient identification was confirmed, informed consent was obtained, and patient was prepped using Betadine solution.  Lidocaine jelly was administered per urethral meatus.    Procedure: - Flexible cystoscope introduced, without any difficulty.   - Thorough search of the bladder revealed:    normal urethral meatus    normal urothelium    no stones    no ulcers     no tumors    Small urethral polyp at the 1 o'clock position approximately, benign-appearing    no trabeculation  - Ureteral orifices in stadium confirmation with bilateral symmetric mild trigonitis just adjacent to each UO.  Post-Procedure: - Patient tolerated the procedure well  Assessment/ Plan:  1. Microscopic hematuria High risk microscopic hematuria status post comprehensive evaluation including negative CT urogram and cystoscopy today- negative  I recommended repeat urinalysis next year with consideration of repeat cystoscopy and renal ultrasound in 2 years if her microscopic hematuria persist or sooner if it progresses  She will return sooner if she develops any gross hematuria or any other significant urinary symptoms  - Urinalysis, Complete  1 year for UA  Hollice Espy, MD

## 2019-09-13 ENCOUNTER — Ambulatory Visit: Payer: Medicare Other | Admitting: Family Medicine

## 2019-09-20 ENCOUNTER — Ambulatory Visit (INDEPENDENT_AMBULATORY_CARE_PROVIDER_SITE_OTHER): Payer: Medicare Other | Admitting: Family Medicine

## 2019-09-20 ENCOUNTER — Encounter: Payer: Self-pay | Admitting: Family Medicine

## 2019-09-20 ENCOUNTER — Other Ambulatory Visit: Payer: Self-pay

## 2019-09-20 VITALS — BP 124/70 | HR 94 | Temp 97.9°F | Resp 14 | Ht 67.0 in | Wt 138.2 lb

## 2019-09-20 DIAGNOSIS — E785 Hyperlipidemia, unspecified: Secondary | ICD-10-CM

## 2019-09-20 DIAGNOSIS — E559 Vitamin D deficiency, unspecified: Secondary | ICD-10-CM

## 2019-09-20 DIAGNOSIS — M810 Age-related osteoporosis without current pathological fracture: Secondary | ICD-10-CM

## 2019-09-20 DIAGNOSIS — Z5181 Encounter for therapeutic drug level monitoring: Secondary | ICD-10-CM

## 2019-09-20 DIAGNOSIS — Z1211 Encounter for screening for malignant neoplasm of colon: Secondary | ICD-10-CM

## 2019-09-20 NOTE — Progress Notes (Signed)
Name: Cheryl [name]   MRN: [id number]    DOB: 12/27/1950   Date:09/20/2019       Progress Note  Chief Complaint  Patient presents with  . Follow-up  . vitamin d deficiency     Subjective:   Cheryl [name] is a 69 y.o. female, presents to clinic for routine follow up on the conditions listed above.  Vit D deficiency -patient was given prescription strength vitamin D supplement but she pt stopped a month ago because she did not notice an improvement in left shoulder pain all that this was reviewed last time that it was for her osteoporosis prevention and vitamin D deficiency.  Osteoporosis started fosamax Jan, had very low vit D, confused previously and today about what it is for, he has continued to take Fosamax once a week she is compliant with how to take medications and denies any dysphagia. Dexa lab and fosamax started with [facility]    Left shoulder pain popping and clicking still ongoing for months -she states she has been to Ortho and they have found nothing wrong.  I can see some visits with Krasinski.  Encouraged her to follow-up with Ortho regarding her symptoms she does have crepitus and decreased range of motion  Due for colonoscopy - referred today   Health Maintenance  Topic Date Due  . COLONOSCOPY  Never done  . PNA vac Low Risk Adult (1 of 2 - PCV13) 06/16/2020 (Originally 09/22/2017)  . Hepatitis C Screening  09/19/2020 (Originally [birth year])  . INFLUENZA VACCINE  01/01/2020  . MAMMOGRAM  04/20/2021  . TETANUS/TDAP  04/20/2029  . DEXA SCAN  Completed  . COVID-19 Vaccine  Discontinued       Patient Active Problem List   Diagnosis Date Noted  . Hyperlipidemia 06/21/2019  . Vitamin D deficiency 06/21/2019  . Hematuria 06/21/2019  . Family history of breast cancer in first degree relative 06/21/2019  . Osteoporosis without current pathological fracture 06/17/2019  . Left shoulder pain 06/17/2019    History reviewed. No pertinent surgical  history.  Family History  Problem Relation Age of Onset  . Breast cancer Mother 69  . Breast cancer Maternal Aunt 70  . Breast cancer Maternal Aunt 70  . Lung cancer Father   . Stroke Brother     Social History   Tobacco Use  . Smoking status: Never Smoker  . Smokeless tobacco: Never Used  Substance Use Topics  . Alcohol use: Never  . Drug use: Never      Current Outpatient Medications:  .  alendronate (FOSAMAX) 70 MG tablet, Take 70 mg by mouth once a week., Disp: , Rfl:  .  cyclobenzaprine (FLEXERIL) 10 MG tablet, cyclobenzaprine 10 mg tablet  TAKE 1 TABLET BY MOUTH EVERYDAY AT BEDTIME, Disp: , Rfl:  .  meloxicam (MOBIC) 15 MG tablet, meloxicam 15 mg tablet  TAKE 1 TABLET (15 MG TOTAL) BY MOUTH ONCE DAILY FOR 30 DOSES, Disp: , Rfl:  .  rosuvastatin (CRESTOR) 10 MG tablet, Take 1 tablet (10 mg total) by mouth at bedtime., Disp: 90 tablet, Rfl: 3 .  Vitamin D, Ergocalciferol, (DRISDOL) 1.25 MG (50000 UNIT) CAPS capsule, Take 1 capsule (50,000 Units total) by mouth every Wednesday and Saturday. x12 weeks., Disp: 24 capsule, Rfl: 0  No Known Allergies  Chart Review Today: I personally reviewed active problem list, medication list, allergies, family history, social history, health maintenance, notes from last encounter, lab results, imaging with the patient/caregiver today.   Review  of Systems  10 Systems reviewed and are negative for acute change except as noted in the HPI.  Objective:    Vitals:   09/20/19 1140  BP: 124/70  Pulse: 94  Resp: 14  Temp: 97.9 F (36.6 C)  SpO2: 98%  Weight: 138 lb 3.2 oz (62.7 kg)  Height: 5\' 7"  (1.702 m)    Body mass index is 21.65 kg/m.  Physical Exam Vitals and nursing note reviewed.  Constitutional:      General: She is not in acute distress.    Appearance: Normal appearance. She is well-developed. She is not ill-appearing, toxic-appearing or diaphoretic.  HENT:     Head: Normocephalic and atraumatic.     Right Ear:  External ear normal.     Left Ear: External ear normal.     Nose: Nose normal.  Eyes:     General:        Right eye: No discharge.        Left eye: No discharge.     Conjunctiva/sclera: Conjunctivae normal.  Neck:     Trachea: No tracheal deviation.  Cardiovascular:     Rate and Rhythm: Normal rate and regular rhythm.     Pulses: Normal pulses.     Heart sounds: Normal heart sounds.  Pulmonary:     Effort: Pulmonary effort is normal. No respiratory distress.     Breath sounds: Normal breath sounds. No stridor.  Musculoskeletal:        General: Normal range of motion.  Skin:    General: Skin is warm and dry.     Findings: No rash.  Neurological:     Mental Status: She is alert.     Motor: No abnormal muscle tone.     Coordination: Coordination normal.  Psychiatric:        Attention and Perception: Attention normal.        Mood and Affect: Mood normal.        Speech: Speech normal.        Behavior: Behavior normal. Behavior is cooperative.        Cognition and Memory: Memory is impaired.       PHQ2/9: Depression screen [facility] 2/9 09/20/2019 06/17/2019  Decreased Interest 0 0  Down, Depressed, Hopeless 0 0  PHQ - 2 Score 0 0  Altered sleeping 0 0  Tired, decreased energy 0 0  Change in appetite 0 0  Feeling bad or failure about yourself  0 0  Trouble concentrating 0 0  Moving slowly or fidgety/restless 0 0  Suicidal thoughts 0 0  PHQ-9 Score 0 0  Difficult doing work/chores Not difficult at all Not difficult at all    phq 9 is neg, reviewed today  Fall Risk: Fall Risk  09/20/2019 06/17/2019  Falls in the past year? 0 0  Number falls in past yr: 0 0  Injury with Fall? 0 0    Functional Status Survey: Is the patient deaf or have difficulty hearing?: Yes(ringing) Does the patient have difficulty seeing, even when wearing glasses/contacts?: No Does the patient have difficulty walking or climbing stairs?: No Does the patient have difficulty dressing or bathing?:  No Does the patient have difficulty doing errands alone such as visiting a doctor's office or shopping?: No   Assessment & Plan:   1. Osteoporosis without current pathological fracture, unspecified osteoporosis type Patient noncompliant with vitamin D supplement after having very low vitamin D, is on Fosamax and has recent history of osteoporosis.  The treatment was  previously reviewed with her but she does seem confused, will recheck labs and determine if she needs further prescription supplement or daily supplement with vitamin D.  Encouraged her to do weight bearing exercises, avoid falls - COMPLETE METABOLIC PANEL WITH GFR - VITAMIN D 25 Hydroxy (Vit-D Deficiency, Fractures)  2. Vitamin D deficiency See above - VITAMIN D 25 Hydroxy (Vit-D Deficiency, Fractures)  3. Screen for colon cancer Referred, she has never done colonoscopy before no melena, hematochezia, change in bowel movements or caliber no family history of colon cancer - Ambulatory referral to Gastroenterology  4. Hyperlipidemia, unspecified hyperlipidemia type History of hyperlipidemia as well will repeat labs today.  She is compliant with Crestor 10 mg at bedtime she is not noted any myalgias since starting this medication, no claudication symptoms no exertional chest pain or shortness of breath no known history of atherosclerotic disease and she does not have diabetes or hypertension. - Lipid panel - COMPLETE METABOLIC PANEL WITH GFR  5. Encounter for medication monitoring - Lipid panel - COMPLETE METABOLIC PANEL WITH GFR - VITAMIN D 25 Hydroxy (Vit-D Deficiency, Fractures)   Return in about 3 months (around 12/20/2019) for Routine follow-up.   Delsa Grana, PA-C 09/20/19 11:55 AM

## 2019-09-20 NOTE — Patient Instructions (Signed)
Keep taking your Vit D supplement and fosamax as prescribed  I will call you with lab results and any med changes  Keep taking crestor for high cholesterol.  This is keeping cholesterol lower and reducing your risk of heart attacks and strokes.  Lab Results  Component Value Date   CHOL 240 (H) 06/17/2019   HDL 55 06/17/2019   LDLCALC 162 (H) 06/17/2019   TRIG 111 06/17/2019   CHOLHDL 4.4 06/17/2019   The 10-year ASCVD risk score Mikey Bussing DC Jr., et al., 2013) is: 9.4%   Values used to calculate the score:     Age: 69 years     Sex: Female     Is Non-Hispanic African American: Yes     Diabetic: No     Tobacco smoker: No     Systolic Blood Pressure: 702 mmHg     Is BP treated: No     HDL Cholesterol: 55 mg/dL     Total Cholesterol: 240 mg/dL   Atherosclerosis  Atherosclerosis is narrowing and hardening of the arteries. Arteries are blood vessels that carry blood from the heart to all parts of the body. This blood contains oxygen. Arteries can become narrow or clogged with a buildup of fat, cholesterol, calcium, and other substances (plaque). Plaque decreases the amount of blood that can flow through the artery. Atherosclerosis can affect any artery in the body, including:  Heart arteries (coronary artery disease). This may cause a heart attack.  Brain arteries. This may cause a stroke (cerebrovascular accident).  Leg, arm, and pelvis arteries (peripheral artery disease). This may cause pain and numbness.  Kidney arteries. This may cause kidney (renal) failure. Treatment may slow the disease and prevent further damage to the heart, brain, peripheral arteries, and kidneys. What are the causes? Atherosclerosis develops slowly over many years. The inner layers of your arteries become damaged and allow the gradual buildup of plaque. The exact cause of atherosclerosis is not fully understood. Symptoms of atherosclerosis do not occur until the artery becomes narrow or blocked. What  increases the risk? The following factors may make you more likely to develop this condition:  High blood pressure.  High cholesterol.  Being middle-aged or older.  Having a family history of atherosclerosis.  Having high blood fats (triglycerides).  Diabetes.  Being overweight.  Smoking tobacco.  Not exercising enough (sedentary lifestyle).  Having a substance in the blood called C-reactive protein (CRP). This is a sign of increased levels of inflammation in the body.  Sleep apnea.  Being stressed.  Drinking too much alcohol. What are the signs or symptoms? This condition may not cause any symptoms. If you have symptoms, they are caused by damage to an area of your body that is not getting enough blood.  Coronary artery disease may cause chest pain and shortness of breath.  Decreased blood supply to your brain may cause a stroke. Signs of a stroke may include sudden: ? Weakness on one side of the body. ? Confusion. ? Changes in vision. ? Inability to speak or understand speech. ? Loss of balance, coordination, or the ability to walk. ? Severe headache. ? Loss of consciousness.  Peripheral arterial disease may cause pain and numbness, often in the legs and hips.  Renal failure may cause fatigue, nausea, swelling, and itchy skin. How is this diagnosed? This condition is diagnosed based on your medical history and a physical exam. During the exam:  Your health care provider will: ? Check your pulse in different places. ?  Listen for a "whooshing" sound over your arteries (bruit).  You may have tests, such as: ? Blood tests to check your levels of cholesterol, triglycerides, and CRP. ? Electrocardiogram (ECG) to check for heart damage. ? Chest X-ray to see if you have an enlarged heart, which is a sign of heart failure. ? Stress test to see how your heart reacts to exercise. ? Echocardiogram to get images of the inside of your heart. ? Ankle-brachial index to  compare blood pressure in your arms to blood pressure in your ankles. ? Ultrasound of your peripheral arteries to check blood flow. ? CT scan to check for damage to your heart or brain. ? X-rays of blood vessels after dye has been injected (angiogram) to check blood flow. How is this treated? Treatment starts with lifestyle changes, which may include:  Changing your diet.  Losing weight.  Reducing stress.  Exercising and being physically active more regularly.  Not smoking. You may also need medicine to:  Lower triglycerides and cholesterol.  Control blood pressure.  Prevent blood clots.  Lower inflammation in your body.  Control your blood sugar. Sometimes, surgery is needed to:  Remove plaque from an artery (endarterectomy).  Open or widen a narrowed heart artery (angioplasty).  Create a new path for your blood with one of these procedures: ? Heart (coronary) artery bypass graft surgery. ? Peripheral artery bypass graft surgery. Follow these instructions at home: Eating and drinking   Eat a heart-healthy diet. Talk with your health care provider or a diet and nutrition specialist (dietitian) if you need help. A heart-healthy diet involves: ? Limiting unhealthy fats and increasing healthy fats. Some examples of healthy fats are olive oil and canola oil. ? Eating plant-based foods, such as fruits, vegetables, nuts, whole grains, and legumes (such as peas and lentils).  Limit alcohol intake to no more than 1 drink a day for nonpregnant women and 2 drinks a day for men. One drink equals 12 oz of beer, 5 oz of wine, or 1 oz of hard liquor. Lifestyle  Follow an exercise program as told by your health care provider.  Maintain a healthy weight. Lose weight if your health care provider says that you need to do that.  Rest when you are tired.  Learn to manage your stress.  Do not use any products that contain nicotine or tobacco, such as cigarettes and e-cigarettes. If  you need help quitting, ask your health care provider.  Do not abuse drugs. General instructions  Take over-the-counter and prescription medicines only as told by your health care provider.  Manage other health conditions as told by your health care provider.  Keep all follow-up visits as told by your health care provider. This is important. Contact a health care provider if:  You have chest pain or discomfort. This includes squeezing chest pain that may feel like indigestion (angina).  You have shortness of breath.  You have an irregular heartbeat.  You have unexplained fatigue.  You have unexplained pain or numbness in an arm, leg, or hip.  You have nausea, swelling of your hands or feet, and itchy skin. Get help right away if:  You have any symptoms of a heart attack, such as: ? Chest pain. ? Shortness of breath. ? Pain in your neck, jaw, arms, back, or stomach. ? Cold sweat. ? Nausea. ? Light-headedness.  You have any symptoms of a stroke. "BE FAST" is an easy way to remember the main warning signs of a stroke: ?  B - Balance. Signs are dizziness, sudden trouble walking, or loss of balance. ? E - Eyes. Signs are trouble seeing or a sudden change in vision. ? F - Face. Signs are sudden weakness or numbness of the face, or the face or eyelid drooping on one side. ? A - Arms. Signs are weakness or numbness in an arm. This happens suddenly and usually on one side of the body. ? S - Speech. Signs are sudden trouble speaking, slurred speech, or trouble understanding what people say. ? T - Time. Time to call emergency services. Write down what time symptoms started.  You have other signs of a stroke, such as: ? A sudden, severe headache with no known cause. ? Nausea or vomiting. ? Seizure. These symptoms may represent a serious problem that is an emergency. Do not wait to see if the symptoms will go away. Get medical help right away. Call your local emergency services (911 in  the [place] Do not drive yourself to the hospital. Summary  Atherosclerosis is narrowing and hardening of the arteries.  Arteries can become narrow or clogged with a buildup of fat, cholesterol, calcium, and other substances (plaque).  This condition may not cause any symptoms. If you do have symptoms, they are caused by damage to an area of your body that is not getting enough blood.  Treatment may include lifestyle changes and medicines. In some cases, surgery is needed. This information is not intended to replace advice given to you by your health care provider. Make sure you discuss any questions you have with your health care provider. Document Revised: 08/28/2017 Document Reviewed: 01/22/2017 Elsevier Patient Education  [place].   Osteoporosis  Osteoporosis happens when your bones get thin and weak. This can cause your bones to break (fracture) more easily. You can do things at home to make your bones stronger. Follow these instructions at home:  Activity  Exercise as told by your doctor. Ask your doctor what activities are safe for you. You should do: ? Exercises that make your muscles work to hold your body weight up (weight-bearing exercises). These include tai chi, yoga, and walking. ? Exercises to make your muscles stronger. One example is lifting weights. Lifestyle  Limit alcohol intake to no more than 1 drink a day for nonpregnant women and 2 drinks a day for men. One drink equals 12 oz of beer, 5 oz of wine, or 1 oz of hard liquor.  Do not use any products that have nicotine or tobacco in them. These include cigarettes and e-cigarettes. If you need help quitting, ask your doctor. Preventing falls  Use tools to help you move around (mobility aids) as needed. These include canes, walkers, scooters, and crutches.  Keep rooms well-lit and free of clutter.  Put away things that could make you trip. These include cords and rugs.  Install safety rails on  stairs. Install grab bars in bathrooms.  Use rubber mats in slippery areas, like bathrooms.  Wear shoes that: ? Fit you well. ? Support your feet. ? Have closed toes. ? Have rubber soles or low heels.  Tell your doctor about all of the medicines you are taking. Some medicines can make you more likely to fall. General instructions  Eat plenty of calcium and vitamin D. These nutrients are good for your bones. Good sources of calcium and vitamin D include: ? Some fatty fish, such as salmon and tuna. ? Foods that have calcium and vitamin D added to them (  fortified foods). For example, some breakfast cereals are fortified with calcium and vitamin D. ? Egg yolks. ? Cheese. ? Liver.  Take over-the-counter and prescription medicines only as told by your doctor.  Keep all follow-up visits as told by your doctor. This is important. Contact a doctor if:  You have not been tested (screened) for osteoporosis and you are: ? A woman who is age 64 or older. ? A man who is age 10 or older. Get help right away if:  You fall.  You get hurt. Summary  Osteoporosis happens when your bones get thin and weak.  Weak bones can break (fracture) more easily.  Eat plenty of calcium and vitamin D. These nutrients are good for your bones.  Tell your doctor about all of the medicines that you take. This information is not intended to replace advice given to you by your health care provider. Make sure you discuss any questions you have with your health care provider. Document Revised: 05/01/2017 Document Reviewed: 03/13/2017 Elsevier Patient Education  2020 [employer].

## 2019-09-21 LAB — COMPLETE METABOLIC PANEL WITH GFR
AG Ratio: 1.1 (calc) (ref 1.0–2.5)
ALT: 10 U/L (ref 6–29)
AST: 16 U/L (ref 10–35)
Albumin: 3.9 g/dL (ref 3.6–5.1)
Alkaline phosphatase (APISO): 73 U/L (ref 37–153)
BUN/Creatinine Ratio: 17 (calc) (ref 6–22)
BUN: 17 mg/dL (ref 7–25)
CO2: 27 mmol/L (ref 20–32)
Calcium: 10.1 mg/dL (ref 8.6–10.4)
Chloride: 106 mmol/L (ref 98–110)
Creat: 1 mg/dL — ABNORMAL HIGH (ref 0.50–0.99)
GFR, Est African American: 68 mL/min/{1.73_m2} (ref >=60)
GFR, Est Non African American: 59 mL/min/{1.73_m2} — ABNORMAL LOW (ref >=60)
Globulin: 3.5 g/dL (calc) (ref 1.9–3.7)
Glucose, Bld: 92 mg/dL (ref 65–99)
Potassium: 4.8 mmol/L (ref 3.5–5.3)
Sodium: 142 mmol/L (ref 135–146)
Total Bilirubin: 0.3 mg/dL (ref 0.2–1.2)
Total Protein: 7.4 g/dL (ref 6.1–8.1)

## 2019-09-21 LAB — LIPID PANEL
Cholesterol: 242 mg/dL — ABNORMAL HIGH (ref <200)
HDL: 53 mg/dL (ref >=50)
LDL Cholesterol (Calc): 167 mg/dL (calc) — ABNORMAL HIGH
Non-HDL Cholesterol (Calc): 189 mg/dL (calc) — ABNORMAL HIGH (ref <130)
Total CHOL/HDL Ratio: 4.6 (calc) (ref <5.0)
Triglycerides: 106 mg/dL (ref <150)

## 2019-09-21 LAB — VITAMIN D 25 HYDROXY (VIT D DEFICIENCY, FRACTURES): Vit D, 25-Hydroxy: 112 ng/mL — ABNORMAL HIGH (ref 30–100)

## 2019-09-22 ENCOUNTER — Telehealth (INDEPENDENT_AMBULATORY_CARE_PROVIDER_SITE_OTHER): Payer: Self-pay | Admitting: Gastroenterology

## 2019-09-22 ENCOUNTER — Telehealth: Payer: Medicare Other

## 2019-09-22 ENCOUNTER — Other Ambulatory Visit: Payer: Self-pay

## 2019-09-22 DIAGNOSIS — Z1211 Encounter for screening for malignant neoplasm of colon: Secondary | ICD-10-CM

## 2019-09-22 NOTE — Progress Notes (Signed)
Gastroenterology Pre-Procedure Review  Request Date: Patient has to check with her daughter Requesting Physician: Dr.Anna PATIENT REVIEW QUESTIONS: The patient responded to the following health history questions as indicated:    1. Are you having any GI issues? no 2. Do you have a personal history of Polyps? no 3. Do you have a family history of Colon Cancer or Polyps? no 4. Diabetes Mellitus? no 5. Joint replacements in the past 12 months?no 6. Major health problems in the past 3 months?no 7. Any artificial heart valves, MVP, or defibrillator?no    MEDICATIONS & ALLERGIES:    Patient reports the following regarding taking any anticoagulation/antiplatelet therapy:   Plavix, Coumadin, Eliquis, Xarelto, Lovenox, Pradaxa, Brilinta, or Effient? no Aspirin? no  Patient confirms/reports the following medications:  Current Outpatient Medications  Medication Sig Dispense Refill  . alendronate (FOSAMAX) 70 MG tablet Take 70 mg by mouth once a week.    . cyclobenzaprine (FLEXERIL) 10 MG tablet cyclobenzaprine 10 mg tablet  TAKE 1 TABLET BY MOUTH EVERYDAY AT BEDTIME    . meloxicam (MOBIC) 15 MG tablet meloxicam 15 mg tablet  TAKE 1 TABLET (15 MG TOTAL) BY MOUTH ONCE DAILY FOR 30 DOSES    . rosuvastatin (CRESTOR) 10 MG tablet Take 1 tablet (10 mg total) by mouth at bedtime. 90 tablet 3  . Vitamin D, Ergocalciferol, (DRISDOL) 1.25 MG (50000 UNIT) CAPS capsule Take 1 capsule (50,000 Units total) by mouth every Wednesday and Saturday. x12 weeks. 24 capsule 0   No current facility-administered medications for this visit.    Patient confirms/reports the following allergies:  No Known Allergies  No orders of the defined types were placed in this encounter.   AUTHORIZATION INFORMATION Primary Insurance: 1D#: Group #:  Secondary Insurance: 1D#: Group #:  SCHEDULE INFORMATION: Date: To Be Determined.  Needs to check with her daughter's schedule. Time: Location:[facility]

## 2019-09-30 ENCOUNTER — Ambulatory Visit (INDEPENDENT_AMBULATORY_CARE_PROVIDER_SITE_OTHER): Payer: Medicare Other

## 2019-09-30 DIAGNOSIS — Z0000 Encounter for general adult medical examination without abnormal findings: Secondary | ICD-10-CM | POA: Diagnosis not present

## 2019-09-30 DIAGNOSIS — Z0100 Encounter for examination of eyes and vision without abnormal findings: Secondary | ICD-10-CM | POA: Diagnosis not present

## 2019-09-30 NOTE — Patient Instructions (Signed)
[name] , Thank you for taking time to come for your Medicare Wellness Visit. I appreciate your ongoing commitment to your health goals. Please review the following plan we discussed and let me know if I can assist you in the future.   Screening recommendations/referrals: Colonoscopy: Referral sent to GI on 09/20/19 Mammogram: done 04/21/19 Bone Density: done 04/19/19 Recommended yearly ophthalmology/optometry visit for glaucoma screening and checkup Recommended yearly dental visit for hygiene and checkup  Vaccinations: Influenza vaccine: postponed Pneumococcal vaccine: postponed Tdap vaccine: done 04/21/19 Shingles vaccine: Shingrix discussed. Please contact your pharmacy for coverage information.  Covid-19: postponed  Advanced directives: Advance directive discussed with you today. I have provided a copy for you to complete at home and have notarized. Once this is complete please bring a copy in to our office so we can scan it into your chart.  Conditions/risks identified:   Free hearing clinics offered in [place]:   [facility] [address], [place], [place] [address] [phone]  [facility], [place], [name] [address] [phone]   Next appointment: Please follow up in one year for your Medicare Annual Wellness visit.     Preventive Care 67 Years and Older, Female Preventive care refers to lifestyle choices and visits with your health care provider that can promote health and wellness. What does preventive care include?  A yearly physical exam. This is also called an annual well check.  Dental exams once or twice a year.  Routine eye exams. Ask your health care provider how often you should have your eyes checked.  Personal lifestyle choices, including:  Daily care of your teeth and gums.  Regular physical activity.  Eating a healthy diet.  Avoiding tobacco and drug use.  Limiting alcohol  use.  Practicing safe sex.  Taking low-dose aspirin every day.  Taking vitamin and mineral supplements as recommended by your health care provider. What happens during an annual well check? The services and screenings done by your health care provider during your annual well check will depend on your age, overall health, lifestyle risk factors, and family history of disease. Counseling  Your health care provider may ask you questions about your:  Alcohol use.  Tobacco use.  Drug use.  Emotional well-being.  Home and relationship well-being.  Sexual activity.  Eating habits.  History of falls.  Memory and ability to understand (cognition).  Work and work Statistician.  Reproductive health. Screening  You may have the following tests or measurements:  Height, weight, and BMI.  Blood pressure.  Lipid and cholesterol levels. These may be checked every 5 years, or more frequently if you are over 67 years old.  Skin check.  Lung cancer screening. You may have this screening every year starting at age 65 if you have a 30-pack-year history of smoking and currently smoke or have quit within the past 15 years.  Fecal occult blood test (FOBT) of the stool. You may have this test every year starting at age 67.  Flexible sigmoidoscopy or colonoscopy. You may have a sigmoidoscopy every 5 years or a colonoscopy every 10 years starting at age 67.  Hepatitis C blood test.  Hepatitis B blood test.  Sexually transmitted disease (STD) testing.  Diabetes screening. This is done by checking your blood sugar (glucose) after you have not eaten for a while (fasting). You may have this done every 1-3 years.  Bone density scan. This is done to screen for osteoporosis.  You may have this done starting at age 67.  Mammogram. This may be done every 1-2 years. Talk to your health care provider about how often you should have regular mammograms. Talk with your health care provider about  your test results, treatment options, and if necessary, the need for more tests. Vaccines  Your health care provider may recommend certain vaccines, such as:  Influenza vaccine. This is recommended every year.  Tetanus, diphtheria, and acellular pertussis (Tdap, Td) vaccine. You may need a Td booster every 10 years.  Zoster vaccine. You may need this after age 67.  Pneumococcal 13-valent conjugate (PCV13) vaccine. One dose is recommended after age 67.  Pneumococcal polysaccharide (PPSV23) vaccine. One dose is recommended after age 67. Talk to your health care provider about which screenings and vaccines you need and how often you need them. This information is not intended to replace advice given to you by your health care provider. Make sure you discuss any questions you have with your health care provider. Document Released: 06/15/2015 Document Revised: 02/06/2016 Document Reviewed: 03/20/2015 Elsevier Interactive Patient Education  2017 [place] Prevention in the Home Falls can cause injuries. They can happen to people of all ages. There are many things you can do to make your home safe and to help prevent falls. What can I do on the outside of my home?  Regularly fix the edges of walkways and driveways and fix any cracks.  Remove anything that might make you trip as you walk through a door, such as a raised step or threshold.  Trim any bushes or trees on the path to your home.  Use bright outdoor lighting.  Clear any walking paths of anything that might make someone trip, such as rocks or tools.  Regularly check to see if handrails are loose or broken. Make sure that both sides of any steps have handrails.  Any raised decks and porches should have guardrails on the edges.  Have any leaves, snow, or ice cleared regularly.  Use sand or salt on walking paths during winter.  Clean up any spills in your garage right away. This includes oil or grease spills. What  can I do in the bathroom?  Use night lights.  Install grab bars by the toilet and in the tub and shower. Do not use towel bars as grab bars.  Use non-skid mats or decals in the tub or shower.  If you need to sit down in the shower, use a plastic, non-slip stool.  Keep the floor dry. Clean up any water that spills on the floor as soon as it happens.  Remove soap buildup in the tub or shower regularly.  Attach bath mats securely with double-sided non-slip rug tape.  Do not have throw rugs and other things on the floor that can make you trip. What can I do in the bedroom?  Use night lights.  Make sure that you have a light by your bed that is easy to reach.  Do not use any sheets or blankets that are too big for your bed. They should not hang down onto the floor.  Have a firm chair that has side arms. You can use this for support while you get dressed.  Do not have throw rugs and other things on the floor that can make you trip. What can I do in the kitchen?  Clean up any spills right away.  Avoid walking on wet floors.  Keep items that you use a  lot in easy-to-reach places.  If you need to reach something above you, use a strong step stool that has a grab bar.  Keep electrical cords out of the way.  Do not use floor polish or wax that makes floors slippery. If you must use wax, use non-skid floor wax.  Do not have throw rugs and other things on the floor that can make you trip. What can I do with my stairs?  Do not leave any items on the stairs.  Make sure that there are handrails on both sides of the stairs and use them. Fix handrails that are broken or loose. Make sure that handrails are as long as the stairways.  Check any carpeting to make sure that it is firmly attached to the stairs. Fix any carpet that is loose or worn.  Avoid having throw rugs at the top or bottom of the stairs. If you do have throw rugs, attach them to the floor with carpet tape.  Make sure  that you have a light switch at the top of the stairs and the bottom of the stairs. If you do not have them, ask someone to add them for you. What else can I do to help prevent falls?  Wear shoes that:  Do not have high heels.  Have rubber bottoms.  Are comfortable and fit you well.  Are closed at the toe. Do not wear sandals.  If you use a stepladder:  Make sure that it is fully opened. Do not climb a closed stepladder.  Make sure that both sides of the stepladder are locked into place.  Ask someone to hold it for you, if possible.  Clearly mark and make sure that you can see:  Any grab bars or handrails.  First and last steps.  Where the edge of each step is.  Use tools that help you move around (mobility aids) if they are needed. These include:  Canes.  Walkers.  Scooters.  Crutches.  Turn on the lights when you go into a dark area. Replace any light bulbs as soon as they burn out.  Set up your furniture so you have a clear path. Avoid moving your furniture around.  If any of your floors are uneven, fix them.  If there are any pets around you, be aware of where they are.  Review your medicines with your doctor. Some medicines can make you feel dizzy. This can increase your chance of falling. Ask your doctor what other things that you can do to help prevent falls. This information is not intended to replace advice given to you by your health care provider. Make sure you discuss any questions you have with your health care provider. Document Released: 03/15/2009 Document Revised: 10/25/2015 Document Reviewed: 06/23/2014 Elsevier Interactive Patient Education  2017 [employer].

## 2019-09-30 NOTE — Progress Notes (Signed)
Subjective:   Cheryl [name] is a 69 y.o. female who presents for an Initial Medicare Annual Wellness Visit.  Virtual Visit via Telephone Note  I connected with  Cheryl [name] on 09/30/19 at  9:20 AM EDT by telephone and verified that I am speaking with the correct person using two identifiers.  Medicare Annual Wellness visit completed telephonically due to Covid-19 pandemic.   Location: Patient: home Provider: office   I discussed the limitations, risks, security and privacy concerns of performing an evaluation and management service by telephone and the availability of in person appointments. The patient expressed understanding and agreed to proceed.  Unable to perform video visit due to patient does not have video capability.   Some vital signs may be absent or patient reported.   Clemetine Marker, LPN    Review of Systems      Cardiac Risk Factors include: advanced age (>50men, >38 women);dyslipidemia     Objective:    There were no vitals filed for this visit. There is no height or weight on file to calculate BMI.  Advanced Directives 09/30/2019  Does Patient Have a Medical Advance Directive? No  Would patient like information on creating a medical advance directive? Yes (MAU/Ambulatory/Procedural Areas - Information given)    Current Medications (verified) Outpatient Encounter Medications as of 09/30/2019  Medication Sig  . alendronate (FOSAMAX) 70 MG tablet Take 70 mg by mouth once a week.  . cyclobenzaprine (FLEXERIL) 10 MG tablet cyclobenzaprine 10 mg tablet  TAKE 1 TABLET BY MOUTH EVERYDAY AT BEDTIME  . meloxicam (MOBIC) 15 MG tablet meloxicam 15 mg tablet  TAKE 1 TABLET (15 MG TOTAL) BY MOUTH ONCE DAILY FOR 30 DOSES  . rosuvastatin (CRESTOR) 10 MG tablet Take 1 tablet (10 mg total) by mouth at bedtime.  . Vitamin D, Ergocalciferol, (DRISDOL) 1.25 MG (50000 UNIT) CAPS capsule Take 1 capsule (50,000 Units total) by mouth every Wednesday and Saturday. x12  weeks.   No facility-administered encounter medications on file as of 09/30/2019.    Allergies (verified) Patient has no known allergies.   History: Past Medical History:  Diagnosis Date  . Hyperlipidemia   . Osteoporosis    History reviewed. No pertinent surgical history. Family History  Problem Relation Age of Onset  . Breast cancer Mother 52  . Breast cancer Maternal Aunt 70  . Breast cancer Maternal Aunt 70  . Lung cancer Father   . Stroke Brother    Social History   Socioeconomic History  . Marital status: Single    Spouse name: Not on file  . Number of children: 1  . Years of education: 44  . Highest education level: Not on file  Occupational History  . Occupation: unemployed  Tobacco Use  . Smoking status: Never Smoker  . Smokeless tobacco: Never Used  Substance and Sexual Activity  . Alcohol use: Never  . Drug use: Never  . Sexual activity: Yes    Comment: One female partner off and on  Other Topics Concern  . Not on file  Social History Narrative   Pt lives with daughter and granddaughter   Social Determinants of Health   Financial Resource Strain: Low Risk   . Difficulty of Paying Living Expenses: Not very hard  Food Insecurity: No Food Insecurity  . Worried About Charity fundraiser in the Last Year: Never true  . Ran Out of Food in the Last Year: Never true  Transportation Needs: No Transportation Needs  . Lack  of Transportation (Medical): No  . Lack of Transportation (Non-Medical): No  Physical Activity: Sufficiently Active  . Days of Exercise per Week: 3 days  . Minutes of Exercise per Session: 60 min  Stress: No Stress Concern Present  . Feeling of Stress : Not at all  Social Connections: Unknown  . Frequency of Communication with Friends and Family: Patient refused  . Frequency of Social Gatherings with Friends and Family: Patient refused  . Attends Religious Services: Patient refused  . Active Member of Clubs or Organizations: Patient  refused  . Attends Archivist Meetings: Patient refused  . Marital Status: Not on file    Tobacco Counseling Counseling given: Not Answered   Clinical Intake:  Pre-visit preparation completed: Yes  Pain : No/denies pain     Nutritional Status: BMI of 19-24  Normal Nutritional Risks: None Diabetes: No  How often do you need to have someone help you when you read instructions, pamphlets, or other written materials from your doctor or pharmacy?: 1 - Never  Interpreter Needed?: No  Information entered by :: Clemetine Marker LPN   Activities of Daily Living In your present state of health, do you have any difficulty performing the following activities: 09/30/2019 09/20/2019  Hearing? Y Y  Comment info provided for hearing eval ringing  Vision? N N  Difficulty concentrating or making decisions? N -  Walking or climbing stairs? N N  Dressing or bathing? N N  Doing errands, shopping? N N  Preparing Food and eating ? N -  Using the Toilet? N -  In the past six months, have you accidently leaked urine? N -  Do you have problems with loss of bowel control? N -  Managing your Medications? N -  Managing your Finances? N -  Housekeeping or managing your Housekeeping? N -  Some recent data might be hidden     Immunizations and Health Maintenance Immunization History  Administered Date(s) Administered  . Tdap 04/21/2019   Health Maintenance Due  Topic Date Due  . COLONOSCOPY  Never done    Patient Care Team: Delsa Grana, PA-C as PCP - General (Family Medicine) Rico Junker, RN as Registered Nurse Theodore Demark, RN as Registered Nurse  Indicate any recent Medical Services you may have received from other than [facility] providers in the past year (date may be approximate).     Assessment:   This is a routine wellness examination for Cheryl [name].  Hearing/Vision screen  Hearing Screening   125Hz  250Hz  500Hz  1000Hz  2000Hz  3000Hz  4000Hz  6000Hz  8000Hz   Right ear:            Left ear:           Comments: Pt states mild hearing difficulty due to ringing in ears.   Free hearing clinics offered in [facility]:   [facility] [place], [place], [place] [address] [phone]  [facility] [place], [place], [place] [address] [phone]    Vision Screening Comments: Pt wears reading glasses. Past due for eye exam. Referral sent today.   Dietary issues and exercise activities discussed: Current Exercise Habits: Home exercise routine, Type of exercise: walking, Time (Minutes): 60, Frequency (Times/Week): 3, Weekly Exercise (Minutes/Week): 180, Intensity: Mild, Exercise limited by: None identified  Goals   None    Depression Screen PHQ 2/9 Scores 09/30/2019 09/20/2019 06/17/2019  PHQ - 2 Score 0 0 0  PHQ- 9 Score - 0 0  Fall Risk Fall Risk  09/30/2019 09/20/2019 06/17/2019  Falls in the past year? 0 0 0  Number falls in past yr: 0 0 0  Injury with Fall? 0 0 0  Risk for fall due to : No Fall Risks - -  Follow up Falls prevention discussed - -   FALL RISK PREVENTION PERTAINING TO THE HOME:  Any stairs in or around the home? No  If so, do they handrails? No   Home free of loose throw rugs in walkways, pet beds, electrical cords, etc? Yes  Adequate lighting in your home to reduce risk of falls? Yes   ASSISTIVE DEVICES UTILIZED TO PREVENT FALLS:  Life alert? No  Use of a cane, walker or w/c? No  Grab bars in the bathroom? No  Shower chair or bench in shower? No  Elevated toilet seat or a handicapped toilet? No   DME ORDERS:  DME order needed?  No   TIMED UP AND GO:  Was the test performed? No . Telephonic visit.   Education: Fall risk prevention has been discussed.  Intervention(s) required? No   Cognitive Function:        Screening Tests Health Maintenance  Topic Date Due  . COLONOSCOPY  Never done  . PNA vac Low Risk Adult (1 of 2 - PCV13) 06/16/2020 (Originally 09/22/2017)  .  Hepatitis C Screening  09/19/2020 (Originally November 06, 1950)  . INFLUENZA VACCINE  01/01/2020  . DEXA SCAN  04/18/2021  . MAMMOGRAM  04/20/2021  . TETANUS/TDAP  04/20/2029  . COVID-19 Vaccine  Discontinued    Qualifies for Shingles Vaccine? Yes . Due for Shingrix. Education has been provided regarding the importance of this vaccine. Pt has been advised to call insurance company to determine out of pocket expense. Advised may also receive vaccine at local pharmacy or Health Dept. Verbalized acceptance and understanding.  Tdap: Up to date  Flu Vaccine: Due for Flu vaccine. Does the patient want to receive this vaccine today?  No . Education has been provided regarding the importance of this vaccine but still declined. Advised may receive this vaccine at local pharmacy or Health Dept. Aware to provide a copy of the vaccination record if obtained from local pharmacy or Health Dept. Verbalized acceptance and understanding.  Pneumococcal Vaccine: Due for Pneumococcal vaccine. Does the patient want to receive this vaccine today?  No . Education has been provided regarding the importance of this vaccine but still declined. Advised may receive this vaccine at local pharmacy or Health Dept. Aware to provide a copy of the vaccination record if obtained from local pharmacy or Health Dept. Verbalized acceptance and understanding.  Covid-19 Vaccine: Due for Covid-19 vaccine. Education has been provided regarding the importance of this vaccine. Advised may receive this vaccine at local pharmacy, health department or  vaccine clinic. Aware to provide a copy of the vaccination record if obtained from local pharmacy or health department. Verbalized acceptance and understanding.    Cancer Screenings:  Colorectal Screening: Not completed. Referral to GI placed 09/20/19. Pt aware the office will call re: appt. Patient states she spoke to [facility] on 09/22/19 and needs to confirm daughter's availability for  transportation for colonoscopy.   Mammogram: Completed 04/21/19. Repeat every year;   Bone Density: Completed 04/19/19. Results reflect OSTEOPOROSIS. Repeat every 2 years.   Lung Cancer Screening: (Low Dose CT Chest recommended if Age 61-80 years, 30 pack-year currently smoking OR have quit w/in 15years.) does not qualify.   Additional Screening:  Hepatitis  C Screening: does qualify; postponed  Vision Screening: Recommended annual ophthalmology exams for early detection of glaucoma and other disorders of the eye. Is the patient up to date with their annual eye exam?  No  Who is the provider or what is the name of the office in which the pt attends annual eye exams? Not established If pt is not established with a provider, would they like to be referred to a provider to establish care? Yes . Ophthalmology referral has been placed. Pt aware the office will call re: appt.  Dental Screening: Recommended annual dental exams for proper oral hygiene  Community Resource Referral:  CRR required this visit?  No      Plan:    I have personally reviewed and addressed the Medicare Annual Wellness questionnaire and have noted the following in the patient's chart:  A. Medical and social history B. Use of alcohol, tobacco or illicit drugs  C. Current medications and supplements D. Functional ability and status E.  Nutritional status F.  Physical activity G. Advance directives H. List of other physicians I.  Hospitalizations, surgeries, and ER visits in previous 12 months J.  [place] such as hearing and vision if needed, cognitive and depression L. Referrals and appointments   In addition, I have reviewed and discussed with patient certain preventive protocols, quality metrics, and best practice recommendations. A written personalized care plan for preventive services as well as general preventive health recommendations were provided to patient.   Signed,  Clemetine Marker,  LPN Nurse Health Advisor   Nurse Notes: none

## 2019-10-04 ENCOUNTER — Other Ambulatory Visit: Payer: Self-pay | Admitting: Family Medicine

## 2019-10-04 DIAGNOSIS — E559 Vitamin D deficiency, unspecified: Secondary | ICD-10-CM

## 2020-03-27 ENCOUNTER — Telehealth: Payer: Self-pay

## 2020-03-27 NOTE — Telephone Encounter (Signed)
Patient left a voicemail on the nurse line. Called patient back and she was wanting to know if she could scheduled her colonoscopy. Informed patient she has a telephone call with Sharyn Lull on Friday at 11:30 to scheduled this. She verbalized understanding

## 2020-03-27 NOTE — Progress Notes (Deleted)
Patient is a 69 year old female patient of Delsa Grana Last visit with her was in April 2021 Was supposed to return in 3 months, with no follow-up obtained Follows up today with stomach cramps/bloating A screening colonoscopy was recommended at that last visit, although was not obtained.    ? Ref to GI, scope rec'ed prior as well

## 2020-03-28 ENCOUNTER — Ambulatory Visit: Payer: Medicare Other | Admitting: Internal Medicine

## 2020-03-30 ENCOUNTER — Telehealth (INDEPENDENT_AMBULATORY_CARE_PROVIDER_SITE_OTHER): Payer: Self-pay | Admitting: Gastroenterology

## 2020-03-30 ENCOUNTER — Other Ambulatory Visit: Payer: Self-pay

## 2020-03-30 DIAGNOSIS — Z1211 Encounter for screening for malignant neoplasm of colon: Secondary | ICD-10-CM

## 2020-03-30 MED ORDER — NA SULFATE-K SULFATE-MG SULF 17.5-3.13-1.6 GM/177ML PO SOLN: 1.0000 | Freq: Once | ORAL | 0 refills | Status: AC

## 2020-03-30 NOTE — Progress Notes (Signed)
Gastroenterology Pre-Procedure Review  Request Date: Wed 04/18/20 Requesting Physician: Dr. Bonna Gains  PATIENT REVIEW QUESTIONS: The patient responded to the following health history questions as indicated:    1. Are you having any GI issues? no 2. Do you have a personal history of Polyps? no 3. Do you have a family history of Colon Cancer or Polyps? no 4. Diabetes Mellitus? no 5. Joint replacements in the past 12 months?no 6. Major health problems in the past 3 months?no 7. Any artificial heart valves, MVP, or defibrillator?no    MEDICATIONS & ALLERGIES:    Patient reports the following regarding taking any anticoagulation/antiplatelet therapy:   Plavix, Coumadin, Eliquis, Xarelto, Lovenox, Pradaxa, Brilinta, or Effient? no Aspirin? no  Patient confirms/reports the following medications:  Current Outpatient Medications  Medication Sig Dispense Refill  . alendronate (FOSAMAX) 70 MG tablet Take 70 mg by mouth once a week.    . cyclobenzaprine (FLEXERIL) 10 MG tablet cyclobenzaprine 10 mg tablet  TAKE 1 TABLET BY MOUTH EVERYDAY AT BEDTIME    . meloxicam (MOBIC) 15 MG tablet meloxicam 15 mg tablet  TAKE 1 TABLET (15 MG TOTAL) BY MOUTH ONCE DAILY FOR 30 DOSES    . rosuvastatin (CRESTOR) 10 MG tablet Take 1 tablet (10 mg total) by mouth at bedtime. 90 tablet 3  . Vitamin D, Ergocalciferol, (DRISDOL) 1.25 MG (50000 UNIT) CAPS capsule Take 1 capsule (50,000 Units total) by mouth every Wednesday and Saturday. x12 weeks. 24 capsule 0  . Na Sulfate-K Sulfate-Mg Sulf 17.5-3.13-1.6 GM/177ML SOLN Take 1 kit by mouth once for 1 dose. 354 mL 0   No current facility-administered medications for this visit.    Patient confirms/reports the following allergies:  No Known Allergies  Orders Placed This Encounter  Procedures  . Procedural/ Surgical Case Request: COLONOSCOPY WITH PROPOFOL    Standing Status:   Standing    Number of Occurrences:   1    Order Specific Question:   Pre-op diagnosis     Answer:   screening colonoscopy    Order Specific Question:   CPT Code    Answer:   46803    AUTHORIZATION INFORMATION Primary Insurance: 1D#: Group #:  Secondary Insurance: 1D#: Group #:  SCHEDULE INFORMATION: Date: 04/18/20 Time: Location:[facility]

## 2020-04-13 ENCOUNTER — Other Ambulatory Visit: Payer: Self-pay | Admitting: Family Medicine

## 2020-04-16 ENCOUNTER — Telehealth: Payer: Self-pay | Admitting: Gastroenterology

## 2020-04-16 ENCOUNTER — Other Ambulatory Visit: Payer: Self-pay

## 2020-04-16 ENCOUNTER — Other Ambulatory Visit
Admission: RE | Admit: 2020-04-16 | Discharge: 2020-04-16 | Disposition: A | Discharge status: Home / Self Care | Payer: Medicare Other | Source: Ambulatory Visit | Attending: Gastroenterology | Admitting: Gastroenterology

## 2020-04-16 DIAGNOSIS — Z20822 Contact with and (suspected) exposure to covid-19: Secondary | ICD-10-CM | POA: Insufficient documentation

## 2020-04-16 DIAGNOSIS — Z01812 Encounter for preprocedural laboratory examination: Secondary | ICD-10-CM | POA: Diagnosis present

## 2020-04-17 LAB — SARS CORONAVIRUS 2 (TAT 6-24 HRS): SARS Coronavirus 2: NEGATIVE

## 2020-04-18 ENCOUNTER — Encounter: Admission: RE | Payer: Self-pay | Source: Home / Self Care

## 2020-04-18 ENCOUNTER — Ambulatory Visit: Admission: RE | Admit: 2020-04-18 | Payer: Medicare Other | Source: Home / Self Care | Admitting: Gastroenterology

## 2020-04-18 SURGERY — COLONOSCOPY WITH PROPOFOL
Anesthesia: General

## 2020-04-20 ENCOUNTER — Other Ambulatory Visit: Payer: Self-pay

## 2020-04-20 ENCOUNTER — Encounter: Payer: Self-pay | Admitting: Family Medicine

## 2020-04-20 ENCOUNTER — Ambulatory Visit (INDEPENDENT_AMBULATORY_CARE_PROVIDER_SITE_OTHER): Payer: Medicare Other | Admitting: Family Medicine

## 2020-04-20 VITALS — BP 126/78 | HR 85 | Temp 98.4°F | Resp 16 | Ht 67.0 in | Wt 140.9 lb

## 2020-04-20 DIAGNOSIS — K5900 Constipation, unspecified: Secondary | ICD-10-CM

## 2020-04-20 DIAGNOSIS — Z1211 Encounter for screening for malignant neoplasm of colon: Secondary | ICD-10-CM | POA: Diagnosis not present

## 2020-04-20 DIAGNOSIS — E559 Vitamin D deficiency, unspecified: Secondary | ICD-10-CM

## 2020-04-20 DIAGNOSIS — E785 Hyperlipidemia, unspecified: Secondary | ICD-10-CM | POA: Diagnosis not present

## 2020-04-20 DIAGNOSIS — R1030 Lower abdominal pain, unspecified: Secondary | ICD-10-CM

## 2020-04-20 DIAGNOSIS — M810 Age-related osteoporosis without current pathological fracture: Secondary | ICD-10-CM | POA: Diagnosis not present

## 2020-04-20 DIAGNOSIS — N390 Urinary tract infection, site not specified: Secondary | ICD-10-CM

## 2020-04-20 MED ORDER — POLYETHYLENE GLYCOL 3350 17 GM/SCOOP PO POWD: ORAL | 1 refills | Status: DC

## 2020-04-20 MED ORDER — ALENDRONATE SODIUM 70 MG PO TABS: 70.0000 mg | ORAL_TABLET | ORAL | 3 refills | Status: DC

## 2020-04-20 NOTE — Patient Instructions (Addendum)
I will send in your fosamax medicine to restart taking once a week - this is for treating osteoporosis - which is brittle bones.   You will also need to be on a daily calcium and vitamin D supplement 1200 mg 1000 IU daily Taking a daily calcium and vitamin D supplement, walking often for exercise, and taking your fosamax medicine once a week is hopefully going to make your bones stronger so you do not have a high risk of breaking a bone  WE will recheck your bone scan again 04/2021  Keep taking your crestor medication at night for cholesterol  I will call you with labs if we need to change the dose.  Let us know if you will do the colonoscopy with GI - which I strongly suggest  Or if you want the cologuard ordered and sent to your house.  Health Maintenance  Topic Date Due  . Colon Cancer Screening  Never done  . Pneumonia vaccines (1 of 2 - PCV13) 06/16/2020*  . Flu Shot  08/30/2020*  .  Hepatitis C: One time screening is recommended by Center for Disease Control  (CDC) for  adults born from [birth year] through 1963.   09/19/2020*  . DEXA scan (bone density measurement)  04/18/2021  . Mammogram  04/20/2021  . Tetanus Vaccine  04/20/2029  . COVID-19 Vaccine  Discontinued  *Topic was postponed. The date shown is not the original due date.    To manage your bowels/constipation: Push more clear fluids throughout the day - urine should be pale yellow to clear - not very dark Avoid caffeine and drink more water or other clear low calorie drinks per day Take a 1/2 cap of miralax and 1/2 dose of benefiber or metamucil once daily  Increase fruits, veggies and whole grains in your diet    Constipation, Adult Constipation is when a person:  Poops (has a bowel movement) fewer times in a week than normal.  Has a hard time pooping.  Has poop that is dry, hard, or bigger than normal. Follow these instructions at home: Eating and drinking   Eat foods that have a lot of fiber, such as: ? Fresh  fruits and vegetables. ? Whole grains. ? Beans.  Eat less of foods that are high in fat, low in fiber, or overly processed, such as: ? [place] fries. ? Hamburgers. ? Cookies. ? Candy. ? Soda.  Drink enough fluid to keep your pee (urine) clear or pale yellow. General instructions  Exercise regularly or as told by your doctor.  Go to the restroom when you feel like you need to poop. Do not hold it in.  Take over-the-counter and prescription medicines only as told by your doctor. These include any fiber supplements.  Do pelvic floor retraining exercises, such as: ? Doing deep breathing while relaxing your lower belly (abdomen). ? Relaxing your pelvic floor while pooping.  Watch your condition for any changes.  Keep all follow-up visits as told by your doctor. This is important. Contact a doctor if:  You have pain that gets worse.  You have a fever.  You have not pooped for 4 days.  You throw up (vomit).  You are not hungry.  You lose weight.  You are bleeding from the anus.  You have thin, pencil-like poop (stool). Get help right away if:  You have a fever, and your symptoms suddenly get worse.  You leak poop or have blood in your poop.  Your belly feels hard or bigger  than normal (is bloated).  You have very bad belly pain.  You feel dizzy or you faint. This information is not intended to replace advice given to you by your health care provider. Make sure you discuss any questions you have with your health care provider. Document Revised: 05/01/2017 Document Reviewed: 11/07/2015 Elsevier Patient Education  [place].   Osteoporosis  Osteoporosis happens when your bones get thin and weak. This can cause your bones to break (fracture) more easily. You can do things at home to make your bones stronger. Follow these instructions at home:  Activity  Exercise as told by your doctor. Ask your doctor what activities are safe for you. You should  do: ? Exercises that make your muscles work to hold your body weight up (weight-bearing exercises). These include tai chi, yoga, and walking. ? Exercises to make your muscles stronger. One example is lifting weights. Lifestyle  Limit alcohol intake to no more than 1 drink a day for nonpregnant women and 2 drinks a day for men. One drink equals 12 oz of beer, 5 oz of wine, or 1 oz of hard liquor.  Do not use any products that have nicotine or tobacco in them. These include cigarettes and e-cigarettes. If you need help quitting, ask your doctor. Preventing falls  Use tools to help you move around (mobility aids) as needed. These include canes, walkers, scooters, and crutches.  Keep rooms well-lit and free of clutter.  Put away things that could make you trip. These include cords and rugs.  Install safety rails on stairs. Install grab bars in bathrooms.  Use rubber mats in slippery areas, like bathrooms.  Wear shoes that: ? Fit you well. ? Support your feet. ? Have closed toes. ? Have rubber soles or low heels.  Tell your doctor about all of the medicines you are taking. Some medicines can make you more likely to fall. General instructions  Eat plenty of calcium and vitamin D. These nutrients are good for your bones. Good sources of calcium and vitamin D include: ? Some fatty fish, such as salmon and tuna. ? Foods that have calcium and vitamin D added to them (fortified foods). For example, some breakfast cereals are fortified with calcium and vitamin D. ? Egg yolks. ? Cheese. ? Liver.  Take over-the-counter and prescription medicines only as told by your doctor.  Keep all follow-up visits as told by your doctor. This is important. Contact a doctor if:  You have not been tested (screened) for osteoporosis and you are: ? A woman who is age 19 or older. ? A man who is age 54 or older. Get help right away if:  You fall.  You get hurt. Summary  Osteoporosis happens when  your bones get thin and weak.  Weak bones can break (fracture) more easily.  Eat plenty of calcium and vitamin D. These nutrients are good for your bones.  Tell your doctor about all of the medicines that you take. This information is not intended to replace advice given to you by your health care provider. Make sure you discuss any questions you have with your health care provider. Document Revised: 05/01/2017 Document Reviewed: 03/13/2017 Elsevier Patient Education  2020 [employer].

## 2020-04-20 NOTE — Progress Notes (Signed)
Name: Cheryl [name]   MRN: [id number]    DOB: [birth year]   Date:04/20/2020       Progress Note  Chief Complaint  Patient presents with  . Stomach Cramps    x 2 weeks, occasional constipation  . Excessive Gas     Subjective:   Cheryl [name] is a 69 y.o. female, presents to clinic for GI sx x 2 weeks  Lower abdominal cramping intermittent, onset 2 weeks ago, some increased gassiness She has BM daily, sometimes very compact and firm and has to strain and sometimes its normal, formed and soft Her last bowel movement was this morning her stomach feels pretty good today and has improved over the last couple days She notes intermittent cramping reports that she " blows out" feel like she has to have a bowel movement but is very gassy instead He denies any melena, hematochezia, hemorrhoids or rectal pain or bleeding No urinary sx Denies N, V, reflux or indigestion She did talk to [facility] was scheduled for her colonoscopy this week but then canceled it and today asks if she could do Cologuard instead she has not had a colonoscopy done in the past several decades but may have had one done when she was a teenager   Hyperlipidemia: Currently treated with crestor 10 mg, pt reports good med compliance- she did start it in april Last Lipids: Lab Results  Component Value Date   CHOL 242 (H) 09/20/2019   HDL 53 09/20/2019   LDLCALC 167 (H) 09/20/2019   TRIG 106 09/20/2019   CHOLHDL 4.6 09/20/2019   - Denies: Chest pain, shortness of breath, myalgias, claudication  She has not done colon cancer screening and was referred to [facility], she did prescreening and then cancelled her colonoscopy, she is interested in cologuard No family hx of CRC   Osteoporosis on fosamax from GYN but she has not seen in a while and ran out of meds Was on Vit D high dose supplement as well with last Vit D level high - now not on any supplements  Dexa 04/2019 per care everywhere     Current  Outpatient Medications:  .  alendronate (FOSAMAX) 70 MG tablet, Take 70 mg by mouth once a week., Disp: , Rfl:  .  cyclobenzaprine (FLEXERIL) 10 MG tablet, cyclobenzaprine 10 mg tablet  TAKE 1 TABLET BY MOUTH EVERYDAY AT BEDTIME, Disp: , Rfl:  .  meloxicam (MOBIC) 15 MG tablet, meloxicam 15 mg tablet  TAKE 1 TABLET (15 MG TOTAL) BY MOUTH ONCE DAILY FOR 30 DOSES, Disp: , Rfl:  .  rosuvastatin (CRESTOR) 10 MG tablet, Take 1 tablet (10 mg total) by mouth at bedtime., Disp: 90 tablet, Rfl: 3 .  Vitamin D, Ergocalciferol, (DRISDOL) 1.25 MG (50000 UNIT) CAPS capsule, Take 1 capsule (50,000 Units total) by mouth every Wednesday and Saturday. x12 weeks., Disp: 24 capsule, Rfl: 0  Patient Active Problem List   Diagnosis Date Noted  . Hyperlipidemia 06/21/2019  . Vitamin D deficiency 06/21/2019  . Hematuria 06/21/2019  . Family history of breast cancer in first degree relative 06/21/2019  . Osteoporosis without current pathological fracture 06/17/2019  . Left shoulder pain 06/17/2019    No past surgical history on file.  Family History  Problem Relation Age of Onset  . Breast cancer Mother 85  . Breast cancer Maternal Aunt 70  . Breast cancer Maternal Aunt 70  . Lung cancer Father   . Stroke Brother     Social  History   Tobacco Use  . Smoking status: Never Smoker  . Smokeless tobacco: Never Used  Vaping Use  . Vaping Use: Never used  Substance Use Topics  . Alcohol use: Never  . Drug use: Never     No Known Allergies  Health Maintenance  Topic Date Due  . COLONOSCOPY  Never done  . PNA vac Low Risk Adult (1 of 2 - PCV13) 06/16/2020 (Originally 09/22/2017)  . INFLUENZA VACCINE  08/30/2020 (Originally 01/01/2020)  . Hepatitis C Screening  09/19/2020 (Originally [birth year])  . DEXA SCAN  04/18/2021  . MAMMOGRAM  04/20/2021  . TETANUS/TDAP  04/20/2029  . COVID-19 Vaccine  Discontinued    Chart Review Today: I personally reviewed active problem list, medication list, allergies,  family history, social history, health maintenance, notes from last encounter, lab results, imaging with the patient/caregiver today.   Review of Systems  10 Systems reviewed and are negative for acute change except as noted in the HPI.  Objective:   Vitals:   04/20/20 1041  BP: 126/78  Pulse: 85  Resp: 16  Temp: 98.4 F (36.9 C)  TempSrc: Oral  SpO2: 99%  Weight: 140 lb 14.4 oz (63.9 kg)  Height: 5\' 7"  (1.702 m)    Body mass index is 22.07 kg/m.  Physical Exam Vitals and nursing note reviewed.  Constitutional:      General: She is not in acute distress.    Appearance: Normal appearance. She is well-developed. She is not ill-appearing, toxic-appearing or diaphoretic.     Interventions: Face mask in place.  HENT:     Head: Normocephalic and atraumatic.     Right Ear: External ear normal.     Left Ear: External ear normal.  Eyes:     General: Lids are normal. No scleral icterus.       Right eye: No discharge.        Left eye: No discharge.     Conjunctiva/sclera: Conjunctivae normal.  Neck:     Trachea: Phonation normal. No tracheal deviation.  Cardiovascular:     Rate and Rhythm: Normal rate and regular rhythm.     Pulses: Normal pulses.          Radial pulses are 2+ on the right side and 2+ on the left side.       Posterior tibial pulses are 2+ on the right side and 2+ on the left side.     Heart sounds: Normal heart sounds. No murmur heard.  No friction rub. No gallop.   Pulmonary:     Effort: Pulmonary effort is normal. No respiratory distress.     Breath sounds: Normal breath sounds. No stridor. No wheezing, rhonchi or rales.  Chest:     Chest wall: No tenderness.  Abdominal:     General: Bowel sounds are normal. There is no distension.     Palpations: Abdomen is soft. There is no shifting dullness, hepatomegaly, splenomegaly, mass or pulsatile mass.     Tenderness: There is no abdominal tenderness. There is no right CVA tenderness, left CVA tenderness,  guarding or rebound.     Comments: Soft abd  Musculoskeletal:     Right lower leg: No edema.     Left lower leg: No edema.  Skin:    General: Skin is warm and dry.     Coloration: Skin is not jaundiced or pale.     Findings: No rash.  Neurological:     Mental Status: She is alert.  Motor: No abnormal muscle tone.     Gait: Gait normal.  Psychiatric:        Mood and Affect: Mood normal.        Speech: Speech normal.        Behavior: Behavior normal.         Assessment & Plan:   1. Lower abdominal pain Bowel sx have improved though pt notes some constipation, gassiness Screened urine for completeness- but pt denies any urinary sx - discomfort is across lower abdomen Encouraged pt to push fluids, advance diet slowly with bland foods Manage chronic constipation with more frequent/routine use of miralax to keep stools soft - COMPLETE METABOLIC PANEL WITH GFR - CBC with Differential/Platelet - Urinalysis, Routine w reflex microscopic - polyethylene glycol powder (GLYCOLAX/MIRALAX) 17 GM/SCOOP powder; Take 1/2 capful to 1 capful (8.5- 17g) g PO by dissolving in clear liquid drink once daily for constipation  Dispense: 578 g; Refill: 1   2. Hyperlipidemia, unspecified hyperlipidemia type Due for recheck of lipids - she took statin for the past few months - she is concerned GI sx may be from statin - we reviewed med SE and adverse rxns - no myalgias, jaundice, fatigue Recheck lipids and adjust labs as needed - COMPLETE METABOLIC PANEL WITH GFR - Lipid panel  3. Screening for malignant neoplasm of colon - Cologuard  4. Osteoporosis without current pathological fracture, unspecified osteoporosis type Continue calcium and Vit D supplements, weight bearing exercise and fosamax Again reviewed management and monitoring with bone scan No dysphagia or concerns with fosamax -- she has been out of meds, restart/refill - COMPLETE METABOLIC PANEL WITH GFR - alendronate (FOSAMAX) 70 MG  tablet; Take 1 tablet (70 mg total) by mouth once a week.  Dispense: 12 tablet; Refill: 3  5. Vitamin D deficiency Monitor calcium - COMPLETE METABOLIC PANEL WITH GFR  6. Constipation, unspecified constipation type See above - polyethylene glycol powder (GLYCOLAX/MIRALAX) 17 GM/SCOOP powder; Take 1/2 capful to 1 capful (8.5- 17g) g PO by dissolving in clear liquid drink once daily for constipation  Dispense: 578 g; Refill: 1  7. Urinary tract infection without hematuria, site unspecified abx later sent in to treat possible UTI with send out urine test Pt encouraged to return to clinic for f/up visit and urine testing with culture if sx not resolved - cephALEXin (KEFLEX) 500 MG capsule; Take 1 capsule (500 mg total) by mouth 3 (three) times daily for 7 days.  Dispense: 21 capsule; Refill: 0   Return in about 6 months (around 10/18/2020) for Routine follow-up.   Delsa Grana, PA-C 04/20/20 11:02 AM

## 2020-04-21 LAB — COMPLETE METABOLIC PANEL WITH GFR
AG Ratio: 1.2 (calc) (ref 1.0–2.5)
ALT: 10 U/L (ref 6–29)
AST: 15 U/L (ref 10–35)
Albumin: 4.3 g/dL (ref 3.6–5.1)
Alkaline phosphatase (APISO): 81 U/L (ref 37–153)
BUN/Creatinine Ratio: 21 (calc) (ref 6–22)
BUN: 21 mg/dL (ref 7–25)
CO2: 28 mmol/L (ref 20–32)
Calcium: 10.1 mg/dL (ref 8.6–10.4)
Chloride: 104 mmol/L (ref 98–110)
Creat: 1 mg/dL — ABNORMAL HIGH (ref 0.50–0.99)
GFR, Est African American: 68 mL/min/{1.73_m2} (ref >=60)
GFR, Est Non African American: 58 mL/min/{1.73_m2} — ABNORMAL LOW (ref >=60)
Globulin: 3.5 g/dL (calc) (ref 1.9–3.7)
Glucose, Bld: 85 mg/dL (ref 65–99)
Potassium: 5.2 mmol/L (ref 3.5–5.3)
Sodium: 139 mmol/L (ref 135–146)
Total Bilirubin: 0.3 mg/dL (ref 0.2–1.2)
Total Protein: 7.8 g/dL (ref 6.1–8.1)

## 2020-04-21 LAB — URINALYSIS, ROUTINE W REFLEX MICROSCOPIC
Bilirubin Urine: NEGATIVE
Glucose, UA: NEGATIVE
Hyaline Cast: NONE SEEN /LPF
Ketones, ur: NEGATIVE
Nitrite: NEGATIVE
Protein, ur: NEGATIVE
Specific Gravity, Urine: 1.02 (ref 1.001–1.03)
pH: 5.5 (ref 5.0–8.0)

## 2020-04-21 LAB — CBC WITH DIFFERENTIAL/PLATELET
Absolute Monocytes: 588 cells/uL (ref 200–950)
Basophils Absolute: 69 cells/uL (ref 0–200)
Basophils Relative: 1.4 %
Eosinophils Absolute: 59 cells/uL (ref 15–500)
Eosinophils Relative: 1.2 %
HCT: 37.4 % (ref 35.0–45.0)
Hemoglobin: 12 g/dL (ref 11.7–15.5)
Lymphs Abs: 2161 cells/uL (ref 850–3900)
MCH: 28.2 pg (ref 27.0–33.0)
MCHC: 32.1 g/dL (ref 32.0–36.0)
MCV: 88 fL (ref 80.0–100.0)
MPV: 9.4 fL (ref 7.5–12.5)
Monocytes Relative: 12 %
Neutro Abs: 2024 cells/uL (ref 1500–7800)
Neutrophils Relative %: 41.3 %
Platelets: 271 10*3/uL (ref 140–400)
RBC: 4.25 10*6/uL (ref 3.80–5.10)
RDW: 14.1 % (ref 11.0–15.0)
Total Lymphocyte: 44.1 %
WBC: 4.9 10*3/uL (ref 3.8–10.8)

## 2020-04-21 LAB — LIPID PANEL
Cholesterol: 184 mg/dL (ref <200)
HDL: 61 mg/dL (ref >=50)
LDL Cholesterol (Calc): 107 mg/dL (calc) — ABNORMAL HIGH
Non-HDL Cholesterol (Calc): 123 mg/dL (calc) (ref <130)
Total CHOL/HDL Ratio: 3 (calc) (ref <5.0)
Triglycerides: 75 mg/dL (ref <150)

## 2020-04-24 MED ORDER — CEPHALEXIN 500 MG PO CAPS: 500.0000 mg | ORAL_CAPSULE | Freq: Three times a day (TID) | ORAL | 0 refills | Status: AC

## 2020-04-25 ENCOUNTER — Telehealth: Payer: Self-pay | Admitting: *Deleted

## 2020-04-25 NOTE — Telephone Encounter (Signed)
Patient returned call for results and was notified: Please call the patient with her results  Her urine did show some blood and white blood cells but it was also a contaminated sample with a lot of skin cells -she may have a UTI but it is difficult to tell from this test -if she continues to have some lower abdominal pain I would send in an antibiotic to treat her for a bladder infection - please let me know  (if she has continued sx I would have her come back to sx after abx and repeat test with clean catch and add urine culture)  I sent in Keflex to cover for a possible UTI-if she is still having symptoms I would want her to take this for least 5 days, if she has continued symptoms she could complete a 7-day course and if no improvement please have her follow-up in clinic  Her CBC was normal and did not show a white count or anything concerning for an infection   Patient has questions about cologard- does she need to pick that up at the office or does she get that in the mail?  Her kidney function and liver function were normal No abnormalities with her digestive enzymes or liver enzymes Her cholesterol improved quite a bit with the Crestor medication - her total cholesterol was 242 and has declined to 184  her bad cholesterol LDL was 167 down to 107 -continue Crestor daily at bedtime

## 2020-04-25 NOTE — Telephone Encounter (Signed)
Left message for patient informing her that [facility] will send everything in mail and she follow directions. (PEC may release message if she calls back )

## 2020-05-03 ENCOUNTER — Other Ambulatory Visit: Payer: Self-pay

## 2020-05-03 ENCOUNTER — Ambulatory Visit (INDEPENDENT_AMBULATORY_CARE_PROVIDER_SITE_OTHER): Payer: Medicare Other | Admitting: Obstetrics

## 2020-05-03 ENCOUNTER — Encounter: Payer: Self-pay | Admitting: Obstetrics

## 2020-05-03 VITALS — BP 130/80 | Ht 67.0 in | Wt 141.0 lb

## 2020-05-03 DIAGNOSIS — Z01419 Encounter for gynecological examination (general) (routine) without abnormal findings: Secondary | ICD-10-CM

## 2020-05-03 DIAGNOSIS — Z1231 Encounter for screening mammogram for malignant neoplasm of breast: Secondary | ICD-10-CM | POA: Diagnosis not present

## 2020-05-03 NOTE — Progress Notes (Signed)
Routine Annual Gynecology Examination   PCP: Delsa Grana, PA-C  Chief Complaint:  Chief Complaint  Patient presents with  . Gynecologic Exam    History of Present Illness: Patient is a 69 y.o. G1P1001 presents for annual exam. The patient has no complaints today.   Menopausal bleeding: denies  Menopausal symptoms: denies  Breast symptoms: denies  Last pap smear: 5 years ago.  Result Normal  Last mammogram: 1 years ago.  Result Normal  Past Medical History:  Diagnosis Date  . Hyperlipidemia   . Osteoporosis     History reviewed. No pertinent surgical history.  Prior to Admission medications   Medication Sig Start Date End Date Taking? Authorizing Provider  alendronate (FOSAMAX) 70 MG tablet Take 1 tablet (70 mg total) by mouth once a week. 04/20/20  Yes Delsa Grana, PA-C  polyethylene glycol powder (GLYCOLAX/MIRALAX) 17 GM/SCOOP powder Take 1/2 capful to 1 capful (8.5- 17g) g PO by dissolving in clear liquid drink once daily for constipation 04/20/20  Yes Delsa Grana, PA-C  rosuvastatin (CRESTOR) 10 MG tablet Take 1 tablet (10 mg total) by mouth at bedtime. 06/22/19  Yes Delsa Grana, PA-C    No Known Allergies  Gynecologic History:  No LMP recorded. Patient is postmenopausal. Contraception: none Last Pap:2016. Results were: normal Last mammogram: 2020. Results were: normal  Obstetric History: G1P1001  Social History   Socioeconomic History  . Marital status: Single    Spouse name: Not on file  . Number of children: 1  . Years of education: 62  . Highest education level: Not on file  Occupational History  . Occupation: unemployed  Tobacco Use  . Smoking status: Never Smoker  . Smokeless tobacco: Never Used  Vaping Use  . Vaping Use: Never used  Substance and Sexual Activity  . Alcohol use: Never  . Drug use: Never  . Sexual activity: Yes    Comment: One female partner off and on  Other Topics Concern  . Not on file  Social History Narrative    Pt lives with daughter and granddaughter   Social Determinants of Health   Financial Resource Strain: Low Risk   . Difficulty of Paying Living Expenses: Not very hard  Food Insecurity: No Food Insecurity  . Worried About Charity fundraiser in the Last Year: Never true  . Ran Out of Food in the Last Year: Never true  Transportation Needs: No Transportation Needs  . Lack of Transportation (Medical): No  . Lack of Transportation (Non-Medical): No  Physical Activity: Sufficiently Active  . Days of Exercise per Week: 3 days  . Minutes of Exercise per Session: 60 min  Stress: No Stress Concern Present  . Feeling of Stress : Not at all  Social Connections: Unknown  . Frequency of Communication with Friends and Family: Patient refused  . Frequency of Social Gatherings with Friends and Family: Patient refused  . Attends Religious Services: Patient refused  . Active Member of Clubs or Organizations: Patient refused  . Attends Archivist Meetings: Patient refused  . Marital Status: Not on file  Intimate Partner Violence: Not At Risk  . Fear of Current or Ex-Partner: No  . Emotionally Abused: No  . Physically Abused: No  . Sexually Abused: No    Family History  Problem Relation Age of Onset  . Breast cancer Mother 34  . Breast cancer Maternal Aunt 70  . Breast cancer Maternal Aunt 70  . Lung cancer Father   . Stroke Brother  Review of Systems  Constitutional: Negative.   HENT: Negative.   Eyes: Negative.   Respiratory: Negative.   Cardiovascular: Negative.   Gastrointestinal: Negative.   Genitourinary: Negative.   Musculoskeletal: Negative.   Skin: Negative.   Neurological: Negative.   Endo/Heme/Allergies: Negative.   Psychiatric/Behavioral: Negative.      Physical Exam Vitals: BP 130/80   Ht 5\' 7"  (1.702 m)   Wt 141 lb (64 kg)   BMI 22.08 kg/m   Physical Exam Constitutional:      Appearance: Normal appearance. She is normal weight.  Genitourinary:      Vulva normal.     Genitourinary Comments: Hair normally distributed. No lesions, rashes or areas of irritation noted. No atrophy noted. Bimanual: anteverted uterus, midline, mobile, non enlarged. No adnexal masses. Rectal deferred.  HENT:     Head: Normocephalic and atraumatic.     Nose: Nose normal.  Cardiovascular:     Rate and Rhythm: Normal rate and regular rhythm.     Pulses: Normal pulses.     Heart sounds: Normal heart sounds.  Pulmonary:     Effort: Pulmonary effort is normal.     Breath sounds: Normal breath sounds.  Abdominal:     General: Bowel sounds are normal.     Palpations: Abdomen is soft.  Musculoskeletal:        General: Normal range of motion.     Cervical back: Normal range of motion and neck supple.  Neurological:     General: No focal deficit present.     Mental Status: She is alert and oriented to person, place, and time.  Skin:    General: Skin is warm and dry.  Psychiatric:        Mood and Affect: Mood normal.        Behavior: Behavior normal.      Female chaperone present for pelvic and breast  portions of the physical exam  Results:   Assessment and Plan:  69 y.o. G25P1001 female here for routine annual gynecologic examination Due for annual mammogram. Is discussing Dexa scan and colonoscopy with her PCP  Plan: Problem List Items Addressed This Visit    None    Visit Diagnoses    Breast cancer screening by mammogram    -  Primary   Women's annual routine gynecological examination          Screening: -- Blood pressure screen normal -- Colonoscopy - due - managed by PCP -- Mammogram - due. Patient to call [facility] to arrange. She understands that it is her responsibility to arrange this. -- Weight screening: normal -- Depression screening negative (PHQ-9) -- Nutrition: normal -- cholesterol screening: per PCP -- osteoporosis screening: Per her PCP -- tobacco screening: not using -- alcohol screening: AUDIT questionnaire indicates  low-risk usage. -- family history of breast cancer screening: done. not at high risk. -- no evidence of domestic violence or intimate partner violence. -- STD screening: gonorrhea/chlamydia NAAT not collected per patient request. -- pap smear not collected per ASCCP guidelines. She does not need to continue with pap smears. -- flu vaccine at the PCP -- HPV vaccination series: not eligilbe   Imagene Riches, CNM  05/03/2020 2:41 PM   05/03/2020 2:34 PM

## 2020-05-04 ENCOUNTER — Other Ambulatory Visit: Payer: Self-pay | Admitting: Obstetrics

## 2020-05-04 DIAGNOSIS — Z1231 Encounter for screening mammogram for malignant neoplasm of breast: Secondary | ICD-10-CM

## 2020-05-11 ENCOUNTER — Ambulatory Visit
Admission: RE | Admit: 2020-05-11 | Discharge: 2020-05-11 | Disposition: A | Discharge status: Home / Self Care | Payer: Medicare Other | Source: Ambulatory Visit | Attending: Obstetrics | Admitting: Obstetrics

## 2020-05-11 ENCOUNTER — Other Ambulatory Visit: Payer: Self-pay

## 2020-05-11 DIAGNOSIS — Z1231 Encounter for screening mammogram for malignant neoplasm of breast: Secondary | ICD-10-CM

## 2020-05-14 ENCOUNTER — Inpatient Hospital Stay
Admission: RE | Admit: 2020-05-14 | Discharge: 2020-05-14 | Disposition: A | Discharge status: Home / Self Care | Payer: Self-pay | Source: Ambulatory Visit | Attending: *Deleted | Admitting: *Deleted

## 2020-05-14 ENCOUNTER — Other Ambulatory Visit: Payer: Self-pay | Admitting: Obstetrics

## 2020-05-14 ENCOUNTER — Other Ambulatory Visit: Payer: Self-pay | Admitting: *Deleted

## 2020-05-14 DIAGNOSIS — R928 Other abnormal and inconclusive findings on diagnostic imaging of breast: Secondary | ICD-10-CM

## 2020-05-14 DIAGNOSIS — Z1231 Encounter for screening mammogram for malignant neoplasm of breast: Secondary | ICD-10-CM

## 2020-05-15 ENCOUNTER — Telehealth: Payer: Self-pay

## 2020-05-15 NOTE — Telephone Encounter (Signed)
Copied from [place] [id number]. Topic: General - Call Back - No Documentation >> May 15, 2020 11:21 AM Erick Blinks wrote: Reason for CRM: Pt called to report to nurse Bonnita Nasuti that she has not received her cologuard yet, she is still waiting.  Best contact: [phone]

## 2020-05-17 NOTE — Telephone Encounter (Signed)
Left message for patient the company will sent information to her

## 2020-05-22 ENCOUNTER — Other Ambulatory Visit: Payer: Self-pay

## 2020-05-22 ENCOUNTER — Ambulatory Visit
Admission: RE | Admit: 2020-05-22 | Discharge: 2020-05-22 | Disposition: A | Discharge status: Home / Self Care | Payer: Medicare Other | Source: Ambulatory Visit | Attending: Obstetrics | Admitting: Obstetrics

## 2020-05-22 ENCOUNTER — Other Ambulatory Visit: Payer: Self-pay | Admitting: Obstetrics

## 2020-05-22 DIAGNOSIS — R928 Other abnormal and inconclusive findings on diagnostic imaging of breast: Secondary | ICD-10-CM

## 2020-05-22 DIAGNOSIS — N6489 Other specified disorders of breast: Secondary | ICD-10-CM

## 2020-05-28 ENCOUNTER — Telehealth: Payer: Self-pay

## 2020-05-28 ENCOUNTER — Telehealth: Payer: Self-pay | Admitting: Obstetrics

## 2020-05-28 NOTE — Telephone Encounter (Signed)
Order faxed to [facility]

## 2020-05-28 NOTE — Telephone Encounter (Signed)
Spoke to patient she do not want to do a Colonoscopy she wants to do Cologuard. Please sign a order

## 2020-05-28 NOTE — Telephone Encounter (Signed)
Cheryl [name].  I called Cheryl [name] after work today, and left her a Engineer, technical sales. She may call the office tomorrow. Wh has a follow up appointment with Radiology  for a needle biopsy, and I do not havf any additional information for her at this time. I left her a lengthy voicemail, but she may reach out again to me with questions.

## 2020-05-28 NOTE — Telephone Encounter (Signed)
I will fax

## 2020-05-28 NOTE — Telephone Encounter (Signed)
Copied from CRM [id number]. Topic: General - Other >> May 24, 2020  2:06 PM Herby Abraham C wrote: Reason for CRM: pt called in to make provider aware that she has not received a cologuard. Pt says that she has been waiting 3 weeks. Provider wanted her to have completed by the end of the year.    Please assist pt

## 2020-05-28 NOTE — Telephone Encounter (Signed)
Patient is calling to speak with [facility] about her mammogram and states she is needing a Biopsy done. Please advise

## 2020-06-05 ENCOUNTER — Ambulatory Visit: Payer: Medicare Other

## 2020-06-22 NOTE — Progress Notes (Signed)
Contacted patient by phone to encourage her following up on her abnormal mammogram. She is set up for a breast biopsy and additional diagnostic mammo and ultrasound. She had cancelled due to bad weather. She agrees to contact [facility] and reschedule Imagene Riches, CNM  06/22/2020 5:00 PM

## 2020-06-27 LAB — EXTERNAL GENERIC LAB PROCEDURE: COLOGUARD: NEGATIVE

## 2020-06-27 LAB — COLOGUARD: Cologuard: NEGATIVE

## 2020-07-03 ENCOUNTER — Telehealth: Payer: Self-pay | Admitting: Family Medicine

## 2020-07-03 NOTE — Telephone Encounter (Signed)
Copied from [place] [id number]. Topic: Referral - Request for Referral >> Jul 03, 2020  3:03 PM Leward Quan A wrote: Has patient seen PCP for this complaint? Yes.   *If NO, is insurance requiring patient see PCP for this issue before PCP can refer them? Referral for which specialty: Dermatologist  Preferred provider/office: [facility] - [place], [place], [place] Reason for referral: Rash on right upper back with pain

## 2020-07-04 ENCOUNTER — Inpatient Hospital Stay: Admission: RE | Admit: 2020-07-04 | Payer: Medicare Other | Source: Ambulatory Visit

## 2020-07-04 NOTE — Telephone Encounter (Signed)
Please advise 

## 2020-07-05 NOTE — Telephone Encounter (Signed)
Please make appointment.

## 2020-07-05 NOTE — Telephone Encounter (Signed)
appt scheduled with Leisa °

## 2020-07-13 ENCOUNTER — Ambulatory Visit: Payer: Medicare Other

## 2020-07-18 ENCOUNTER — Ambulatory Visit: Payer: Medicare Other | Admitting: Family Medicine

## 2020-07-25 ENCOUNTER — Other Ambulatory Visit: Payer: Self-pay

## 2020-07-25 ENCOUNTER — Ambulatory Visit (INDEPENDENT_AMBULATORY_CARE_PROVIDER_SITE_OTHER): Payer: Medicare Other | Admitting: Family Medicine

## 2020-07-25 ENCOUNTER — Other Ambulatory Visit: Payer: Self-pay | Admitting: Family Medicine

## 2020-07-25 ENCOUNTER — Encounter: Payer: Self-pay | Admitting: Family Medicine

## 2020-07-25 VITALS — BP 129/80 | HR 82 | Wt 141.0 lb

## 2020-07-25 DIAGNOSIS — R928 Other abnormal and inconclusive findings on diagnostic imaging of breast: Secondary | ICD-10-CM

## 2020-07-25 DIAGNOSIS — N63 Unspecified lump in unspecified breast: Secondary | ICD-10-CM

## 2020-07-25 DIAGNOSIS — N6489 Other specified disorders of breast: Secondary | ICD-10-CM

## 2020-07-25 NOTE — Progress Notes (Signed)
   GYNECOLOGY PROBLEM  VISIT ENCOUNTER NOTE  Subjective:   Cheryl [name] is a 70 y.o. G91P1001 female here for problem GYN visit.  Current complaints: right breast mass, would like second opinion. She reports she was sent for her routine mammogram and they found a mass. She is able to feel it as well. She reports she did not like her experience. She was at the exam and knew something was wrong and then was told a doctor would talk to her but they did not and then she was told they would call but they did not. The patient is scared about having breast cancer. She mother died of breast cancer and her maternal aunts had breast cancer.   Denies abnormal vaginal bleeding, discharge, pelvic pain, problems with intercourse or other gynecologic concerns.    Gynecologic History No LMP recorded. Patient is postmenopausal. Contraception: post menopausal status  Health Maintenance Due  Topic Date Due  . COLONOSCOPY (Pts 45-48yrs Insurance coverage will need to be confirmed)  Never done  . PNA vac Low Risk Adult (1 of 2 - PCV13) Never done     The following portions of the patient's history were reviewed and updated as appropriate: allergies, current medications, past family history, past medical history, past social history, past surgical history and problem list.  Review of Systems Pertinent items are noted in HPI.   Objective:  BP 129/80   Pulse 82   Wt 141 lb (64 kg)   BMI 22.08 kg/m  Gen: well appearing, NAD HEENT: no scleral icterus CV: RR Lung: Normal WOB Ext: warm well perfused  Breast: Slight asymmetric R>L, everted nipples bilaterally. Right and left breast with dense tissue.  Discrete <1cm mass palpated at 10 o'clock, mobile, firm.   Non-tender to palpation. No nipple discharge bilaterally.   Assessment and Plan:   1. Breast mass in female Exam and mammogram are concerning. I discussed with patient that without a tissue sample we are unable to know whether this is cancer I  expressed that though I am just meeting her, I would recommend she proceed with the work up She would like to go to another breast center-- Crosby Oyster RN was able to call the [facility] and directly schedule biopsy and results will return to me Provided support to patient who was tearful  Appointment at [facility] is scheduled March 1 @12 :45  Face to face time:  30 minutes  Greater than 50% of the visit time was spent in counseling and coordination of care with the patient.  The summary and outline of the counseling and care coordination is summarized in the note above.   All questions were answered.  Please refer to After Visit Summary for other counseling recommendations.   Return breast biopsy results.  Caren Macadam, MD, MPH, ABFM Attending [place] for [facility]

## 2020-07-25 NOTE — Progress Notes (Signed)
Lump in right breast for a couple of months that does move around. Had a mammo in 04/2020- also had ultrasound and they want to do a biopsy, pt here to get second opinion

## 2020-07-31 ENCOUNTER — Other Ambulatory Visit: Payer: Medicare Other

## 2020-07-31 DIAGNOSIS — C50919 Malignant neoplasm of unspecified site of unspecified female breast: Secondary | ICD-10-CM

## 2020-07-31 HISTORY — DX: Malignant neoplasm of unspecified site of unspecified female breast: C50.919

## 2020-08-03 ENCOUNTER — Ambulatory Visit
Admission: RE | Admit: 2020-08-03 | Discharge: 2020-08-03 | Disposition: A | Discharge status: Home / Self Care | Payer: Medicare Other | Source: Ambulatory Visit | Attending: Family Medicine | Admitting: Family Medicine

## 2020-08-03 ENCOUNTER — Other Ambulatory Visit: Payer: Self-pay | Admitting: Family Medicine

## 2020-08-03 ENCOUNTER — Other Ambulatory Visit: Payer: Self-pay

## 2020-08-03 ENCOUNTER — Ambulatory Visit
Admission: RE | Admit: 2020-08-03 | Discharge: 2020-08-03 | Disposition: A | Discharge status: Home / Self Care | Payer: Medicare Other | Source: Ambulatory Visit | Attending: Obstetrics | Admitting: Obstetrics

## 2020-08-03 DIAGNOSIS — R928 Other abnormal and inconclusive findings on diagnostic imaging of breast: Secondary | ICD-10-CM

## 2020-08-03 DIAGNOSIS — N6489 Other specified disorders of breast: Secondary | ICD-10-CM

## 2020-08-03 DIAGNOSIS — E785 Hyperlipidemia, unspecified: Secondary | ICD-10-CM

## 2020-08-07 ENCOUNTER — Other Ambulatory Visit: Payer: Self-pay | Admitting: Family Medicine

## 2020-08-07 DIAGNOSIS — E785 Hyperlipidemia, unspecified: Secondary | ICD-10-CM

## 2020-08-07 MED ORDER — ROSUVASTATIN CALCIUM 10 MG PO TABS: ORAL_TABLET | ORAL | 0 refills | Status: DC

## 2020-08-07 NOTE — Telephone Encounter (Signed)
Pt is calling in to request a refill for rosuvastatin (CRESTOR) 10 MG tablet . Pt says that she contacted her pharmacy and was told that she didn't have refill on file. Contact her office. Pt says that she also sent a mychart message, not showing in chart.    Pharmacy:  [id number] - [place], [place] - [address] Phone:  [phone]  Fax:  [phone]

## 2020-08-08 HISTORY — PX: BREAST BIOPSY: SHX20

## 2020-08-13 NOTE — Progress Notes (Signed)
Name: Cheryl [name]   MRN: [id number]    DOB: [birth year]   Date:08/15/2020       Progress Note  Subjective  Chief Complaint  Rash upper back with pain/Referral to dermatology  HPI  Back pain: she came in stating she had an itchy rash, but rash not seen, hydrocortisone has not improved symptoms. She states pain on her mid back is sharp, deep in the spine at times and sometimes is aching, not constant, seems to be worse when showering and water touches her skin.   Breathing issues: she states she has noticed that she has to stop to catch her breath over the past couple of months, no swelling, no orthopnea, she denies decrease in exercise tolerance, but she has a anxiety in the past. She has been nervous about her spot on her spine. She states when she takes a deep breath symptoms resolved, episodes are almost daily , also waking up with that sensation at night.   New diagnosis of breast cancer: biopsy done on 08/03/2020 Grade II invasive mammary carcinoma, going to see surgeon at [facility] next week. . Explained her stress will be up, and we will start her on medication for depression and anxiety today  Patient Active Problem List   Diagnosis Date Noted  . Malignant neoplasm of upper-outer quadrant of right breast in female, estrogen receptor positive ([place]) 08/15/2020  . Hyperlipidemia 06/21/2019  . Vitamin D deficiency 06/21/2019  . Hematuria 06/21/2019  . Family history of breast cancer in first degree relative 06/21/2019  . Osteoporosis without current pathological fracture 06/17/2019  . Left shoulder pain 06/17/2019    History reviewed. No pertinent surgical history.  Family History  Problem Relation Age of Onset  . Breast cancer Mother 41  . Breast cancer Maternal Aunt 70  . Breast cancer Maternal Aunt 70  . Lung cancer Father   . Stroke Brother     Social History   Tobacco Use  . Smoking status: Never Smoker  . Smokeless tobacco: Never Used  Substance  Use Topics  . Alcohol use: Never     Current Outpatient Medications:  .  alendronate (FOSAMAX) 70 MG tablet, Take 1 tablet (70 mg total) by mouth once a week., Disp: 12 tablet, Rfl: 3 .  lidocaine (LIDODERM) 5 %, Place 1 patch onto the skin daily. Remove & Discard patch within 12 hours or as directed by MD, Disp: 30 patch, Rfl: 0 .  polyethylene glycol powder (GLYCOLAX/MIRALAX) 17 GM/SCOOP powder, Take 1/2 capful to 1 capful (8.5- 17g) g PO by dissolving in clear liquid drink once daily for constipation, Disp: 578 g, Rfl: 1 .  rosuvastatin (CRESTOR) 10 MG tablet, TAKE 1 TABLET BY MOUTH EVERYDAY AT BEDTIME, Disp: 90 tablet, Rfl: 0  No Known Allergies  I personally reviewed active problem list, medication list, allergies, family history, social history with the patient/caregiver today.   ROS  Ten systems reviewed and is negative except as mentioned in HPI   Objective  Vitals:   08/15/20 0948  BP: 122/82  Pulse: 88  Resp: 14  Temp: 98.1 F (36.7 C)  TempSrc: Oral  SpO2: 99%  Weight: 142 lb 8 oz (64.6 kg)  Height: 5\' 7"  (1.702 m)    Body mass index is 22.32 kg/m.  Physical Exam  Constitutional: Patient appears well-developed and well-nourished. No distress.  HEENT: head atraumatic, normocephalic, pupils equal and reactive to light, neck supple Cardiovascular: Normal rate, regular rhythm and normal heart sounds.  No  murmur heard. No BLE edema. Pulmonary/Chest: Effort normal and breath sounds normal. No respiratory distress. Skin: no rashes, some hyperpigmented  Muscular skeletal: only has pain during palpation of mid thoracic spine, radiates to both sides of her back.  Abdominal: Soft.  There is no tenderness. Psychiatric: Patient has a normal mood and affect. behavior is normal. Judgment and thought content normal.  Recent Results (from the past 2160 hour(s))  Cologuard     Status: None   Collection Time: 06/15/20 12:00 AM  Result Value Ref Range   Cologuard Negative  Negative    PHQ2/9: Depression screen [facility] 2/9 08/15/2020 04/20/2020 09/30/2019 09/20/2019 06/17/2019  Decreased Interest 0 0 0 0 0  Down, Depressed, Hopeless 0 0 0 0 0  PHQ - 2 Score 0 0 0 0 0  Altered sleeping - - - 0 0  Tired, decreased energy - - - 0 0  Change in appetite - - - 0 0  Feeling bad or failure about yourself  - - - 0 0  Trouble concentrating - - - 0 0  Moving slowly or fidgety/restless - - - 0 0  Suicidal thoughts - - - 0 0  PHQ-9 Score - - - 0 0  Difficult doing work/chores - - - Not difficult at all Not difficult at all    phq 9 is negative   Fall Risk: Fall Risk  08/15/2020 04/20/2020 09/30/2019 09/20/2019 06/17/2019  Falls in the past year? 0 0 0 0 0  Number falls in past yr: 0 0 0 0 0  Injury with Fall? 0 0 0 0 0  Risk for fall due to : - - No Fall Risks - -  Follow up - - Falls prevention discussed - -    Functional Status Survey: Is the patient deaf or have difficulty hearing?: No Does the patient have difficulty seeing, even when wearing glasses/contacts?: No Does the patient have difficulty concentrating, remembering, or making decisions?: No Does the patient have difficulty walking or climbing stairs?: No Does the patient have difficulty dressing or bathing?: No Does the patient have difficulty doing errands alone such as visiting a doctor's office or shopping?: No    Assessment & Plan  1. Pain in thoracic spine  - lidocaine (LIDODERM) 5 %; Place 1 patch onto the skin daily. Remove & Discard patch within 12 hours or as directed by MD  Dispense: 30 patch; Refill: 0 - DG Thoracic Spine 4V; Future  2. Paresthesia  - lidocaine (LIDODERM) 5 %; Place 1 patch onto the skin daily. Remove & Discard patch within 12 hours or as directed by MD  Dispense: 30 patch; Refill: 0 - DG Thoracic Spine 4V; Future  3. Malignant neoplasm of upper-outer quadrant of right breast in female, estrogen receptor positive ([place])  Keep follow up with surgeon   4. Anxiety  -  hydrOXYzine (ATARAX/VISTARIL) 10 MG tablet; Take 1 tablet (10 mg total) by mouth daily as needed. For anxiety, it will cause sedation  Dispense: 30 tablet; Refill: 0 - escitalopram (LEXAPRO) 5 MG tablet; Take 1 tablet (5 mg total) by mouth daily. With food in am's  Dispense: 30 tablet; Refill: 1

## 2020-08-15 ENCOUNTER — Encounter: Payer: Self-pay | Admitting: Adult Health

## 2020-08-15 ENCOUNTER — Encounter: Payer: Self-pay | Admitting: Family Medicine

## 2020-08-15 ENCOUNTER — Other Ambulatory Visit: Payer: Self-pay

## 2020-08-15 ENCOUNTER — Ambulatory Visit (INDEPENDENT_AMBULATORY_CARE_PROVIDER_SITE_OTHER): Payer: Medicare Other | Admitting: Family Medicine

## 2020-08-15 VITALS — BP 122/82 | HR 88 | Temp 98.1°F | Resp 14 | Ht 67.0 in | Wt 142.5 lb

## 2020-08-15 DIAGNOSIS — R202 Paresthesia of skin: Secondary | ICD-10-CM | POA: Diagnosis not present

## 2020-08-15 DIAGNOSIS — M546 Pain in thoracic spine: Secondary | ICD-10-CM

## 2020-08-15 DIAGNOSIS — F419 Anxiety disorder, unspecified: Secondary | ICD-10-CM

## 2020-08-15 DIAGNOSIS — Z170 Estrogen receptor positive status [ER+]: Secondary | ICD-10-CM | POA: Insufficient documentation

## 2020-08-15 DIAGNOSIS — C50411 Malignant neoplasm of upper-outer quadrant of right female breast: Secondary | ICD-10-CM | POA: Diagnosis not present

## 2020-08-15 DIAGNOSIS — R21 Rash and other nonspecific skin eruption: Secondary | ICD-10-CM

## 2020-08-15 MED ORDER — HYDROXYZINE HCL 10 MG PO TABS: 10.0000 mg | ORAL_TABLET | Freq: Every day | ORAL | 0 refills | Status: DC | PRN

## 2020-08-15 MED ORDER — ESCITALOPRAM OXALATE 5 MG PO TABS: 5.0000 mg | ORAL_TABLET | Freq: Every day | ORAL | 1 refills | Status: DC

## 2020-08-15 MED ORDER — LIDOCAINE 5 % EX PTCH: 1.0000 | MEDICATED_PATCH | CUTANEOUS | 0 refills | Status: DC

## 2020-08-16 ENCOUNTER — Ambulatory Visit (INDEPENDENT_AMBULATORY_CARE_PROVIDER_SITE_OTHER): Payer: Medicare Other | Admitting: Physician Assistant

## 2020-08-16 ENCOUNTER — Encounter: Payer: Self-pay | Admitting: Physician Assistant

## 2020-08-16 VITALS — BP 147/88 | HR 82 | Ht 67.0 in | Wt 143.0 lb

## 2020-08-16 DIAGNOSIS — R3129 Other microscopic hematuria: Secondary | ICD-10-CM | POA: Diagnosis not present

## 2020-08-16 LAB — URINALYSIS, COMPLETE
Bilirubin, UA: NEGATIVE
Glucose, UA: NEGATIVE
Ketones, UA: NEGATIVE
Nitrite, UA: NEGATIVE
Protein,UA: NEGATIVE
Specific Gravity, UA: 1.02 (ref 1.005–1.030)
Urobilinogen, Ur: 0.2 mg/dL (ref 0.2–1.0)
pH, UA: 6.5 (ref 5.0–7.5)

## 2020-08-16 LAB — MICROSCOPIC EXAMINATION
Epithelial Cells (non renal): >10 /hpf — AB (ref 0–10)
WBC, UA: >30 /hpf — AB (ref 0–5)

## 2020-08-16 NOTE — Patient Instructions (Signed)
Dr. Erlene Quan will see you back in clinic for another urinalysis in one year. If anything changes before then and you can see blood in your urine or develop flank pain or pain with urination, please call our office to be seen sooner.

## 2020-08-16 NOTE — Progress Notes (Signed)
08/16/2020 11:54 AM   Cheryl [name] 06/11/1950 [id number]  CC: Chief Complaint  Patient presents with  . Hematuria  . Follow-up   HPI: Cheryl [name] is a 70 y.o. female with microscopic hematuria with benign work-up in 2021 who presents today for annual follow-up and UA.  Patient reports doing well over the past year.  She has had no episodes of gross hematuria, flank pain, or dysuria.  No acute concerns today.  In-office UA today positive for 2+ blood and 1+ leukocyte esterase; urine microscopy with >30 WBCs/HPF, 3-10 RBCs/HPF, and >10 epithelial cells/hpf.  PMH: Past Medical History:  Diagnosis Date  . Hyperlipidemia   . Osteoporosis     Surgical History: No past surgical history on file.  Home Medications:  Allergies as of 08/16/2020   No Known Allergies     Medication List       Accurate as of August 16, 2020 11:54 AM. If you have any questions, ask your nurse or doctor.        alendronate 70 MG tablet Commonly known as: FOSAMAX Take 1 tablet (70 mg total) by mouth once a week.   escitalopram 5 MG tablet Commonly known as: Lexapro Take 1 tablet (5 mg total) by mouth daily. With food in am's   hydrOXYzine 10 MG tablet Commonly known as: ATARAX/VISTARIL Take 1 tablet (10 mg total) by mouth daily as needed. For anxiety, it will cause sedation   lidocaine 5 % Commonly known as: Lidoderm Place 1 patch onto the skin daily. Remove & Discard patch within 12 hours or as directed by MD   polyethylene glycol powder 17 GM/SCOOP powder Commonly known as: GLYCOLAX/MIRALAX Take 1/2 capful to 1 capful (8.5- 17g) g PO by dissolving in clear liquid drink once daily for constipation   rosuvastatin 10 MG tablet Commonly known as: CRESTOR TAKE 1 TABLET BY MOUTH EVERYDAY AT BEDTIME       Allergies:  No Known Allergies  Family History: Family History  Problem Relation Age of Onset  . Breast cancer Mother 75  . Breast cancer Maternal Aunt 70  . Breast  cancer Maternal Aunt 70  . Lung cancer Father   . Stroke Brother     Social History:   reports that she has never smoked. She has never used smokeless tobacco. She reports that she does not drink alcohol and does not use drugs.  Physical Exam: BP (!) 147/88   Pulse 82   Ht 5\' 7"  (1.702 m)   Wt 143 lb (64.9 kg)   BMI 22.40 kg/m   Constitutional:  Alert and oriented, no acute distress, nontoxic appearing HEENT: [place], AT Cardiovascular: No clubbing, cyanosis, or edema Respiratory: Normal respiratory effort, no increased work of breathing Skin: No rashes, bruises or suspicious lesions Neurologic: Grossly intact, no focal deficits, moving all 4 extremities Psychiatric: Normal mood and affect  Laboratory Data: Results for orders placed or performed in visit on 08/16/20  Microscopic Examination   Urine  Result Value Ref Range   WBC, UA >30 (A) 0 - 5 /hpf   RBC 3-10 (A) 0 - 2 /hpf   Epithelial Cells (non renal) >10 (A) 0 - 10 /hpf   Renal Epithel, UA 0-10 (A) None seen /hpf   Bacteria, UA Few None seen/Few  Urinalysis, Complete  Result Value Ref Range   Specific Gravity, UA 1.020 1.005 - 1.030   pH, UA 6.5 5.0 - 7.5   Color, UA Yellow Yellow   Appearance Ur  Cloudy (A) Clear   Leukocytes,UA 1+ (A) Negative   Protein,UA Negative Negative/Trace   Glucose, UA Negative Negative   Ketones, UA Negative Negative   RBC, UA 2+ (A) Negative   Bilirubin, UA Negative Negative   Urobilinogen, Ur 0.2 0.2 - 1.0 mg/dL   Nitrite, UA Negative Negative   Microscopic Examination See below:    Assessment & Plan:   1. Microscopic hematuria Continued MH today, although UA is contaminated.  She has had no warning signs in the past year including gross hematuria, flank pain, and dysuria.  We discussed the need for annual repeat UAs for monitoring with repeat hematuria work-up every 3 to 5 years if her hematuria is persistent.  We reviewed return precautions today including gross hematuria, flank pain,  and dysuria.  She expressed understanding. - Urinalysis, Complete  Return in about 1 year (around 08/16/2021) for Annual [place] f/u with repeat UA.  Debroah Loop, PA-C  [facility] [name], [place], [place] [address] [phone]

## 2020-08-17 ENCOUNTER — Ambulatory Visit: Payer: Self-pay | Admitting: Surgery

## 2020-08-17 DIAGNOSIS — C50911 Malignant neoplasm of unspecified site of right female breast: Secondary | ICD-10-CM

## 2020-08-17 NOTE — H&P (Signed)
Paschal Dopp [facility] Appointment: 08/17/2020 9:40 AM Location: [facility] Patient #: [id number] DOB: 1951/06/02 Single / Language: Cleophus Molt / Race: Black or African American Female  History of Present Illness Marcello Moores A. Orion Mole MD; 08/17/2020 12:41 PM) Patient words: Patient sent for evaluation of abnormal right breast mammogram. She has been followed for distortion in her right breast upper outer quadrant. On her most recent imaging, a 2.3 cm area DISTORTION was noted. 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 She had a mother and 2 maternal aunts with breast cancer. This is her first breast biopsy. Patient denies any history of breast pain nipple discharge or breast pain.   CLINICAL DATA: 70 year old female presenting as a recall from screening for possible right breast distortion.  EXAM: DIGITAL DIAGNOSTIC RIGHT MAMMOGRAM WITH TOMO  ULTRASOUND RIGHT BREAST  COMPARISON: Previous exam(s).  ACR Breast Density Category c: The breast tissue is heterogeneously dense, which may obscure small masses.  FINDINGS: Mammogram:  Spot compression tomosynthesis as well as full field mL tomosynthesis views of the right breast were performed demonstrating persistence of an asymmetry with possible subtle distortion on the ML and spot MLO views in the superior right breast far posterior depth. No definite correlate identified on the CC view.  Ultrasound:  Targeted ultrasound is performed in the right breast at 10 o'clock 6 cm from the nipple posterior depth demonstrating a focal hypoechoic area with echogenic halo measuring 2.3 x 1.3 x 1.6 cm. This likely corresponds to the asymmetry seen mammographically.  Targeted ultrasound of the right axilla demonstrates normal-appearing lymph nodes.  IMPRESSION: Right breast asymmetry with possible subtle distortion. There is a likely sonographic  correlate at 10 o'clock in the right breast.  RECOMMENDATION: Ultrasound-guided core needle biopsy of the right breast at 10 o'clock. Recommend close attention on post procedure mammogram to ensure correlation between the mammographic and sonographic findings.  I have discussed the findings and recommendations with the patient who agrees to proceed with biopsy. If applicable, a reminder letter will be sent to the patient regarding the next appointment.  BI-RADS CATEGORY 4: Suspicious.   Electronically Signed By: Audie Pinto M.D. On: 05/22/2020 09:48              REASON FOR ADDENDUM, AMENDMENT OR CORRECTION: [id number]: E-cadherin results. kh 08/06/20 02:52:37 PM ADDITIONAL INFORMATION: PROGNOSTIC INDICATORS Results: IMMUNOHISTOCHEMICAL AND MORPHOMETRIC ANALYSIS PERFORMED MANUALLY The tumor cells are NEGATIVE for Her2 (1+). Estrogen Receptor: 95%, POSITIVE, STRONG-MODERATE STAINING INTENSITY Progesterone Receptor: 5%, POSITIVE, MODERATE STAINING INTENSITY Proliferation Marker Ki67: 2% REFERENCE RANGE ESTROGEN RECEPTOR NEGATIVE 0% POSITIVE =>1% REFERENCE RANGE PROGESTERONE RECEPTOR NEGATIVE 0% POSITIVE =>1% All controls stained appropriately Thressa Sheller MD Pathologist, Electronic Signature ( Signed 08/09/2020) FINAL DIAGNOSIS Diagnosis Breast, right, needle core biopsy, 10 o'clock, 6 cmfn - INVASIVE MAMMARY CARCINOMA. 1 of 3 Amended copy Addendum FINAL for Levee, Myah A ([id number]) Microscopic Comment The carcinoma appears grade 2. The greatest linear extent of tumor in any one core is 17 mm. E-cadherin will be reported separately. Ancillary studies will be reported separately. Results reported to [facility] on 08/06/2020. Dr. Saralyn Pilar reviewed the case. ADDENDUM: Immunohistochemistry for E-cadherin is negative supporting a lobular phenotype. Gillie Manners MD Pathologist, Electronic Signature (Case signed  08/06/2020).  The patient is a 70 year old female.   Past Surgical History Janeann Forehand, CNA; 08/17/2020 9:27 AM) No pertinent past surgical history  Diagnostic Studies History Janeann Forehand, CNA;  08/17/2020 9:27 AM) Colonoscopy within last year  Allergies Janeann Forehand, CNA; 08/17/2020 9:28 AM) No Known Drug Allergies [08/17/2020]: Allergies Reconciled  Medication History Janeann Forehand, CNA; 08/17/2020 9:28 AM) Alendronate Sodium (70MG Tablet, Oral) Active. Cephalexin (500MG Capsule, Oral) Active. [employer] Purelax (17GM/SCOOP Powder, Oral) Active. hydrOXYzine HCl (10MG Tablet, Oral) Active. Escitalopram Oxalate (5MG Tablet, Oral) Active. Rosuvastatin Calcium (10MG Tablet, Oral) Active. Suprep Bowel Prep Kit (17.5-3.13-1.6GM/177ML Solution, Oral) Active. Medications Reconciled  Social History Janeann Forehand, CNA; 08/17/2020 9:27 AM) Caffeine use Coffee. No drug use  Family History Janeann Forehand, CNA; 08/17/2020 9:27 AM) Cancer Family Members In General. Heart Disease Family Members In General.  Pregnancy / Birth History Janeann Forehand, CNA; 08/17/2020 9:27 AM) Maternal age 30-30 Para 62  Other Problems Janeann Forehand, CNA; 08/17/2020 9:27 AM) Hypercholesterolemia     Review of Systems Janeann Forehand CNA; 08/17/2020 9:27 AM) Skin Present- Rash. Not Present- Change in Wart/Mole, Dryness, Hives, Jaundice, New Lesions, Non-Healing Wounds and Ulcer.  Vitals Adriana Reams [place]ston CNA; 08/17/2020 9:29 AM) 08/17/2020 9:28 AM Weight: 141.13 lb Height: 67in Body Surface Area: 1.74 m Body Mass Index: 22.1 kg/m  Temp.: 97.9F  Pulse: 99 (Regular)  P.OX: 97% (Room air) BP: 130/80(Sitting, Left Arm, Standard)        Physical Exam (Adysen Raphael A. Camila Maita MD; 08/17/2020 12:41 PM)  General Mental Status-Alert. General Appearance-Consistent with stated age. Hydration-Well hydrated. Voice-Normal.  Head and  Neck Head-normocephalic, atraumatic with no lesions or palpable masses. Trachea-midline. Thyroid Gland Characteristics - normal size and consistency.  Eye Eyeball - Bilateral-Extraocular movements intact. Sclera/Conjunctiva - Bilateral-No scleral icterus.  Chest and Lung Exam Chest and lung exam reveals -quiet, even and easy respiratory effort with no use of accessory muscles and on auscultation, normal breath sounds, no adventitious sounds and normal vocal resonance. Inspection Chest Wall - Normal. Back - normal.  Breast Breast - Left-Symmetric, Non Tender, No Biopsy scars, no Dimpling - Left, No Inflammation, No Lumpectomy scars, No Mastectomy scars, No Peau d' Orange. Breast - Right-Symmetric, Non Tender, No Biopsy scars, no Dimpling - Right, No Inflammation, No Lumpectomy scars, No Mastectomy scars, No Peau d' Orange. Breast Lump-No Palpable Breast Mass.  Cardiovascular Cardiovascular examination reveals -normal heart sounds, regular rate and rhythm with no murmurs and normal pedal pulses bilaterally.  Abdomen Inspection Inspection of the abdomen reveals - No Hernias. Skin - Scar - no surgical scars. Palpation/Percussion Palpation and Percussion of the abdomen reveal - Soft, Non Tender, No Rebound tenderness, No Rigidity (guarding) and No hepatosplenomegaly. Auscultation Auscultation of the abdomen reveals - Bowel sounds normal.  Neurologic Neurologic evaluation reveals -alert and oriented x 3 with no impairment of recent or remote memory. Mental Status-Normal.  Musculoskeletal Normal Exam - Left-Upper Extremity Strength Normal and Lower Extremity Strength Normal. Normal Exam - Right-Upper Extremity Strength Normal and Lower Extremity Strength Normal.  Lymphatic Head & Neck  General Head & Neck Lymphatics: Bilateral - Description - Normal. Axillary  General Axillary Region: Bilateral - Description - Normal. Tenderness - Non Tender. Femoral &  Inguinal  Generalized Femoral & Inguinal Lymphatics: Bilateral - Description - Normal. Tenderness - Non Tender.    Assessment & Plan (Adriona Kaney A. Shilynn Hoch MD; 08/17/2020 12:46 PM)  BREAST CANCER, RIGHT (C50.911) Impression: ILC-low-grade-stage II  Recommendation magnetic resonance imaging Discussed breast conserving surgery versus mastectomy. She is a good breast conserving candidate and recommended right breast seed localized lumpectomy with right axillary sentinel lymph node mapping. This may change depending on magnetic resonance imaging findings. Discussed potential reexcision issues  with invasive lobular carcinoma and the rationale for obtaining an magnetic resonance imaging. Discussed sentinel lymph node mapping and the pros and cons of this as well today. Discussed mastectomy and reconstruction. Refer to medical and radiation oncology, genetics and obtain magnetic resonance imaging. Plan breast conserving surgery unless something else changes in her workup.    Risk of lumpectomy include bleeding, infection, seroma, more surgery, use of seed/wire, wound care, cosmetic deformity and the need for other treatments, death , blood clots, death. Pt agrees to proceed. Risk of sentinel lymph node mapping include bleeding, lymphedema infection, lymphedema, shoulder pain. stiffness, dye allergy. cosmetic deformity , blood clots, death, need for more surgery. Pt agrees to proceed.   Total time 45 minutes face-to-face time, examination, chart review, documentation in the EHR  Current Plans You are being scheduled for surgery- Our schedulers will call you.  You should hear from our office's scheduling department within 5 working days about the location, date, and time of surgery. We try to make accommodations for patient's preferences in scheduling surgery, but sometimes the OR schedule or the surgeon's schedule prevents [place] from making those accommodations.  If you have not heard from our office  [phone]) in 5 working days, call the office and ask for your surgeon's nurse.  If you have other questions about your diagnosis, plan, or surgery, call the office and ask for your surgeon's nurse.  We discussed the staging and pathophysiology of breast cancer. We discussed all of the different options for treatment for breast cancer including surgery, chemotherapy, radiation therapy, Herceptin, and antiestrogen therapy. We discussed a sentinel lymph node biopsy as she does not appear to having lymph node involvement right now. We discussed the performance of that with injection of radioactive tracer and blue dye. We discussed that she would have an incision underneath her axillary hairline. We discussed that there is a bout a 10-20% chance of having a positive node with a sentinel lymph node biopsy and we will await the permanent pathology to make any other first further decisions in terms of her treatment. One of these options might be to return to the operating room to perform an axillary lymph node dissection. We discussed about a 1-2% risk lifetime of chronic shoulder pain as well as lymphedema associated with a sentinel lymph node biopsy. We discussed the options for treatment of the breast cancer which included lumpectomy versus a mastectomy. We discussed the performance of the lumpectomy with a wire placement. We discussed a 10-20% chance of a positive margin requiring reexcision in the operating room. We also discussed that she may need radiation therapy or antiestrogen therapy or both if she undergoes lumpectomy. We discussed the mastectomy and the postoperative care for that as well. We discussed that there is no difference in her survival whether she undergoes lumpectomy with radiation therapy or antiestrogen therapy versus a mastectomy. There is a slight difference in the local recurrence rate being 3-5% with lumpectomy and about 1% with a mastectomy. We discussed the risks of operation  including bleeding, infection, possible reoperation. She understands her further therapy will be based on what her stages at the time of her operation.  Pt Education - flb breast cancer surgery: discussed with patient and provided information. Pt Education - [facility] Breast Biopsy HCI: discussed with patient and provided information. Pt Education - ABC (After Breast Cancer) Class Info: discussed with patient and provided information.

## 2020-08-20 ENCOUNTER — Telehealth: Payer: Self-pay

## 2020-08-20 NOTE — Telephone Encounter (Signed)
Copied from [place] [id number]. Topic: Referral - Request for Referral >> Aug 20, 2020 12:34 PM Alanda Slim E wrote: Has patient seen PCP for this complaint? Yes  *If NO, is insurance requiring patient see PCP for this issue before PCP can refer them? Referral for which specialty: Dermatology  Preferred provider/office: [facility] or anyone with availability asap  Reason for referral: Rash on back / needs a referral for appt

## 2020-08-20 NOTE — Telephone Encounter (Signed)
Please schedule, does any one have opening this week?

## 2020-08-21 ENCOUNTER — Ambulatory Visit: Payer: Medicare Other | Admitting: Family Medicine

## 2020-08-21 ENCOUNTER — Other Ambulatory Visit: Payer: Self-pay | Admitting: Surgery

## 2020-08-21 DIAGNOSIS — C50911 Malignant neoplasm of unspecified site of right female breast: Secondary | ICD-10-CM

## 2020-08-22 DIAGNOSIS — C50919 Malignant neoplasm of unspecified site of unspecified female breast: Secondary | ICD-10-CM

## 2020-08-22 NOTE — Progress Notes (Signed)
Left message for patient to call back to schedule Med/Onc and to initiate Navigation.

## 2020-08-23 NOTE — Progress Notes (Signed)
Navigation initiated.  Patient scheduled for  Med/Onc consult with Dr. Grayland Ormond on 08/29/20.

## 2020-08-27 ENCOUNTER — Ambulatory Visit
Admission: RE | Admit: 2020-08-27 | Discharge: 2020-08-27 | Disposition: A | Discharge status: Home / Self Care | Payer: Medicare Other | Source: Ambulatory Visit | Attending: Surgery | Admitting: Surgery

## 2020-08-27 ENCOUNTER — Other Ambulatory Visit: Payer: Self-pay

## 2020-08-27 DIAGNOSIS — C50911 Malignant neoplasm of unspecified site of right female breast: Secondary | ICD-10-CM

## 2020-08-27 MED ORDER — GADOBUTROL 1 MMOL/ML IV SOLN
6.0000 mL | Freq: Once | INTRAVENOUS | Status: AC | PRN
  Administered 2020-08-27: 6 mL via INTRAVENOUS

## 2020-08-29 ENCOUNTER — Inpatient Hospital Stay: Payer: Medicare Other | Attending: Internal Medicine | Admitting: Internal Medicine

## 2020-08-29 ENCOUNTER — Inpatient Hospital Stay: Payer: Medicare Other

## 2020-08-29 ENCOUNTER — Other Ambulatory Visit: Payer: Self-pay

## 2020-08-29 ENCOUNTER — Telehealth: Payer: Self-pay | Admitting: *Deleted

## 2020-08-29 ENCOUNTER — Encounter: Payer: Self-pay | Admitting: Internal Medicine

## 2020-08-29 DIAGNOSIS — C50411 Malignant neoplasm of upper-outer quadrant of right female breast: Secondary | ICD-10-CM | POA: Insufficient documentation

## 2020-08-29 DIAGNOSIS — E785 Hyperlipidemia, unspecified: Secondary | ICD-10-CM | POA: Insufficient documentation

## 2020-08-29 DIAGNOSIS — R110 Nausea: Secondary | ICD-10-CM | POA: Insufficient documentation

## 2020-08-29 DIAGNOSIS — Z79899 Other long term (current) drug therapy: Secondary | ICD-10-CM | POA: Insufficient documentation

## 2020-08-29 DIAGNOSIS — Z803 Family history of malignant neoplasm of breast: Secondary | ICD-10-CM | POA: Diagnosis not present

## 2020-08-29 DIAGNOSIS — B029 Zoster without complications: Secondary | ICD-10-CM | POA: Diagnosis not present

## 2020-08-29 DIAGNOSIS — Z170 Estrogen receptor positive status [ER+]: Secondary | ICD-10-CM | POA: Diagnosis not present

## 2020-08-29 NOTE — Progress Notes (Signed)
New pt for breast cancer. Pt also got shingles little over 1 week ago on right leg. She is some soreness from breast bx. She wanted to know when or if she needs surgery that can she get her surgery in [place]. [facility] has a packet of goodies for the pt. I did call the [place] [facility] and asked about surgery in [place]. Dr. Brantley Stage use to do surgeries here but not sure if he still has credentials. Office staff with check and call me back.pt aware.

## 2020-08-29 NOTE — Progress Notes (Signed)
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 ER-95%; /PR >5%; her 2 negative; G-2. MRI march 28th, 2022- 4.4 cm irregular enhancing mass far posteriorly in the upper-outer quadrant of the right breast abutting and likely invading the pectoralis muscle.     Malignant neoplasm of upper-outer quadrant of right breast in female, estrogen receptor positive ([place])  08/15/2020 Initial Diagnosis   Malignant neoplasm of upper-outer quadrant of right breast in female, estrogen receptor positive ([place])      HISTORY OF PRESENTING ILLNESS:  Cheryl [name] 70 y.o.  female female with no prior history of breast cancer/or malignancies has been referred to [place] for further evaluation recommendations for new diagnosis of breast cancer.   Patient states she was found to have an abnormal screening mammogram which led to diagnostic mammogram/ultrasound/followed by biopsy-as summarized above.  Patient had MRI breast early in the week-findings summarized as above.  Patient had been evaluated by [facility] surgery in [place].  She is here to discuss medical oncology options.  Denies any pain.  Denies any skin changes.  No headaches.  Nausea no vomiting.  Of note patient was recently evaluated by urgent care-for rash on her legs-diagnosis shingles.  She has been treated with Valtrex.  Notes that the lesions are scabbing.  Review of Systems  Constitutional: Negative for chills, diaphoresis, fever, malaise/fatigue and weight loss.  HENT: Negative for nosebleeds and sore throat.   Eyes: Negative for double vision.  Respiratory:  Negative for cough, hemoptysis, sputum production, shortness of breath and wheezing.   Cardiovascular: Negative for chest pain, palpitations, orthopnea and leg swelling.  Gastrointestinal: Negative for abdominal pain, blood in stool, constipation, diarrhea, heartburn, melena, nausea and vomiting.  Genitourinary: Negative for dysuria, frequency and urgency.  Musculoskeletal: Negative for back pain and joint pain.  Skin: Negative.  Negative for itching and rash.  Neurological: Negative for dizziness, tingling, focal weakness, weakness and headaches.  Endo/Heme/Allergies: Does not bruise/bleed easily.  Psychiatric/Behavioral: Negative for depression. The patient is not nervous/anxious and does not have insomnia.      MEDICAL HISTORY:  Past Medical History:  Diagnosis Date  . Breast cancer ([place])   . Hyperlipidemia   . Osteoporosis     SURGICAL HISTORY: Past Surgical History:  Procedure Laterality Date  . BREAST BIOPSY      SOCIAL HISTORY: Social History   Socioeconomic History  . Marital status: Single    Spouse name: Not on file  . Number of children: 1  . Years of education: 78  . Highest education level: Not on file  Occupational History  . Occupation: unemployed  Tobacco Use  . Smoking status: Never Smoker  . Smokeless tobacco: Never Used  Vaping Use  . Vaping Use: Never used  Substance and Sexual Activity  . Alcohol use: Never  . Drug use: Never  . Sexual activity: Yes    Comment: One female partner off and on  Other Topics Concern  . Not on file  Social History Narrative   Pt lives with daughter and granddaughter in [place]. Worked as CMA at [employer]. Never smoked; no alcohol.    Social Determinants of Health  Financial Resource Strain: Low Risk   . Difficulty of Paying Living Expenses: Not very hard  Food Insecurity: No Food Insecurity  . Worried About Charity fundraiser in the Last Year: Never true  . Ran Out of Food in the Last Year: Never true   Transportation Needs: No Transportation Needs  . Lack of Transportation (Medical): No  . Lack of Transportation (Non-Medical): No  Physical Activity: Sufficiently Active  . Days of Exercise per Week: 3 days  . Minutes of Exercise per Session: 60 min  Stress: No Stress Concern Present  . Feeling of Stress : Not at all  Social Connections: Unknown  . Frequency of Communication with Friends and Family: Patient refused  . Frequency of Social Gatherings with Friends and Family: Patient refused  . Attends Religious Services: Patient refused  . Active Member of Clubs or Organizations: Patient refused  . Attends Archivist Meetings: Patient refused  . Marital Status: Not on file  Intimate Partner Violence: Not At Risk  . Fear of Current or Ex-Partner: No  . Emotionally Abused: No  . Physically Abused: No  . Sexually Abused: No    FAMILY HISTORY: Family History  Problem Relation Age of Onset  . Breast cancer Mother 56  . Breast cancer Maternal Aunt 70  . Breast cancer Maternal Aunt 70  . Throat cancer Father   . Stroke Brother     ALLERGIES:  has No Known Allergies.  MEDICATIONS:  Current Outpatient Medications  Medication Sig Dispense Refill  . alendronate (FOSAMAX) 70 MG tablet Take 1 tablet (70 mg total) by mouth once a week. 12 tablet 3  . lidocaine (LIDODERM) 5 % Place 1 patch onto the skin daily. Remove & Discard patch within 12 hours or as directed by MD 30 patch 0  . polyethylene glycol powder (GLYCOLAX/MIRALAX) 17 GM/SCOOP powder Take 1/2 capful to 1 capful (8.5- 17g) g PO by dissolving in clear liquid drink once daily for constipation 578 g 1  . rosuvastatin (CRESTOR) 10 MG tablet TAKE 1 TABLET BY MOUTH EVERYDAY AT BEDTIME 90 tablet 0  . escitalopram (LEXAPRO) 5 MG tablet Take 1 tablet (5 mg total) by mouth daily. With food in am's (Patient not taking: Reported on 08/29/2020) 30 tablet 1  . hydrOXYzine (ATARAX/VISTARIL) 10 MG tablet Take 1 tablet (10 mg total) by  mouth daily as needed. For anxiety, it will cause sedation (Patient not taking: No sig reported) 30 tablet 0   No current facility-administered medications for this visit.    PHYSICAL EXAMINATION: ECOG PERFORMANCE STATUS: 0 - Asymptomatic  Vitals:   08/29/20 1416  BP: (!) 142/72  Pulse: 76  Resp: 16  Temp: 98.1 F (36.7 C)   Filed Weights   08/29/20 1416  Weight: 142 lb 11.2 oz (64.7 kg)    Physical Exam HENT:     Head: Normocephalic and atraumatic.     Mouth/Throat:     Pharynx: No oropharyngeal exudate.  Eyes:     Pupils: Pupils are equal, round, and reactive to light.  Cardiovascular:     Rate and Rhythm: Normal rate and regular rhythm.  Pulmonary:     Effort: No respiratory distress.     Breath sounds: No wheezing.  Abdominal:     General: Bowel sounds are normal. There is no distension.     Palpations: Abdomen is soft. There is no mass.     Tenderness: There is no abdominal tenderness. There is no guarding or rebound.  Musculoskeletal:  General: No tenderness. Normal range of motion.     Cervical back: Normal range of motion and neck supple.  Skin:    General: Skin is warm.     Comments: Right and left BREAST exam (in the presence of nurse)- no unusual skin changes.  Deep-seated approximately 2 to 3 cm mass noted right upper quadrant.  No tenderness.   Neurological:     Mental Status: She is alert and oriented to person, place, and time.  Psychiatric:        Mood and Affect: Affect normal.      LABORATORY DATA:  I have reviewed the data as listed Lab Results  Component Value Date   WBC 4.9 04/20/2020   HGB 12.0 04/20/2020   HCT 37.4 04/20/2020   MCV 88.0 04/20/2020   PLT 271 04/20/2020   Recent Labs    09/20/19 1218 04/20/20 1127  NA 142 139  K 4.8 5.2  CL 106 104  CO2 27 28  GLUCOSE 92 85  BUN 17 21  CREATININE 1.00* 1.00*  CALCIUM 10.1 10.1  GFRNONAA 59* 58*  GFRAA 68 68  PROT 7.4 7.8  AST 16 15  ALT 10 10  BILITOT 0.3 0.3     RADIOGRAPHIC STUDIES: I have personally reviewed the radiological images as listed and agreed with the findings in the report. MR BREAST BILATERAL W WO CONTRAST INC CAD  Result Date: 08/27/2020 CLINICAL DATA:  Biopsy proven invasive mammary carcinoma in the 10 o'clock region of the right breast. LABS:  None obtained at the time of imaging. EXAM: BILATERAL BREAST MRI WITH AND WITHOUT CONTRAST TECHNIQUE: Multiplanar, multisequence MR images of both breasts were obtained prior to and following the intravenous administration of 6 ml of Gadavist Three-dimensional MR images were rendered by post-processing of the original MR data on an independent workstation. The three-dimensional MR images were interpreted, and findings are reported in the following complete MRI report for this study. Three dimensional images were evaluated at the independent interpreting workstation using the DynaCAD thin client. COMPARISON:  Previous exam(s). FINDINGS: Breast composition: c. Heterogeneous fibroglandular tissue. Background parenchymal enhancement: Moderate. Right breast: There is an irregular spiculated mass far posteriorly in the upper-outer quadrant of the right breast (series 12, image 133) measuring 3.2 x 1.5 x 4.4 cm. The mass abuts and likely invades the pectoralis muscle. A signal void artifact is seen in the mass from the biopsy marker clip. Left breast: No mass or abnormal enhancement. Lymph nodes: No abnormal axillary adenopathy identified. Ancillary findings:  None. IMPRESSION: 4.4 cm irregular enhancing mass far posteriorly in the upper-outer quadrant of the right breast abutting and likely invading the pectoralis muscle. The mass corresponds with the biopsy proven invasive mammary carcinoma. RECOMMENDATION: Treatment planning of the biopsy proven invasive mammary carcinoma in the right breast is recommended. BI-RADS CATEGORY  6: Known biopsy-proven malignancy. Electronically Signed   By: Lillia Mountain M.D.   On:  08/27/2020 16:13   MM CLIP PLACEMENT RIGHT  Result Date: 08/03/2020 CLINICAL DATA:  Post biopsy mammogram of the right breast for clip placement. EXAM: DIAGNOSTIC RIGHT MAMMOGRAM POST ULTRASOUND BIOPSY COMPARISON:  Previous exam(s). FINDINGS: Mammographic images were obtained following ultrasound guided biopsy of a right breast mass at 10 o'clock. The biopsy marking clip is in expected position at the site of biopsy on the tomosynthesis ML image. The area is too far lateral to be captured on the CC image. IMPRESSION: Appropriate positioning of the coil shaped biopsy marking clip at the  site of biopsy in the upper-outer right breast. Final Assessment: Post Procedure Mammograms for Marker Placement Electronically Signed   By: Ammie Ferrier M.D.   On: 08/03/2020 13:30   [place] RT BREAST BX W LOC DEV 1ST LESION IMG BX SPEC US GUIDE  Addendum Date: 08/08/2020   ADDENDUM REPORT: 08/08/2020 09:00 ADDENDUM: Surgical consultation has been arranged with Dr. Erroll Luna at [facility] on August 17, 2020. Terie Purser, RN on 08/08/2020. Electronically Signed   By: Ammie Ferrier M.D.   On: 08/08/2020 09:00   Addendum Date: 08/07/2020   ADDENDUM REPORT: 08/07/2020 12:41 ADDENDUM: Pathology revealed GRADE II INVASIVE MAMMARY CARCINOMA of the Right breast, 10 o'clock, 6 cmfn. Immunohistochemistry for E-cadherin is negative supporting a lobular phenotype. This was found to be concordant by Dr. Ammie Ferrier. Pathology results were discussed with the patient by telephone. The patient reported doing well after the biopsy with tenderness at the site. Post biopsy instructions and care were reviewed and questions were answered. The patient was encouraged to call [facility] for any additional concerns. My direct phone number was provided. The patient was contacted by [facility] on August 06, 2020, to arrange a surgical referral. Recommendation for a bilateral breast MRI  given the lobular histology, family history and breast density. Pathology results reported by Terie Purser, RN on 08/07/2020. Electronically Signed   By: Ammie Ferrier M.D.   On: 08/07/2020 12:41   Result Date: 08/08/2020 CLINICAL DATA:  70 year old female presenting for ultrasound-guided biopsy of a right breast mass. EXAM: ULTRASOUND GUIDED RIGHT BREAST CORE NEEDLE BIOPSY COMPARISON:  Previous exam(s). PROCEDURE: I met with the patient and we discussed the procedure of ultrasound-guided biopsy, including benefits and alternatives. We discussed the high likelihood of a successful procedure. We discussed the risks of the procedure, including infection, bleeding, tissue injury, clip migration, and inadequate sampling. Informed written consent was given. The usual time-out protocol was performed immediately prior to the procedure. Lesion quadrant: Upper outer quadrant Using sterile technique and 1% Lidocaine as local anesthetic, under direct ultrasound visualization, a 14 gauge spring-loaded device was used to perform biopsy of a mass in the right breast at 10 o'clock, 6 cm from the nipple using an inferior approach. At the conclusion of the procedure a coil shaped tissue marker clip was deployed into the biopsy cavity. Follow up 2 view mammogram was performed and dictated separately. IMPRESSION: Ultrasound guided biopsy of a right breast mass at 10 o'clock. No apparent complications. Electronically Signed: By: Ammie Ferrier M.D. On: 08/03/2020 13:20    ASSESSMENT & PLAN:   Malignant neoplasm of upper-outer quadrant of right breast in female, estrogen receptor positive ([place]) #Lobular invasive breast cancer-MRI breast-approximately 4.5 cm mass right upper quadrant; however concerning for invasion of the pectoralis muscle on imaging.  No evidence of lymph nodes.  # I had a long discussion with the patient in general regarding the treatment options of breast cancer including-surgery; adjuvant radiation;  role of adjuvant systemic therapy including-chemotherapy and antihormone therapy.   #Given the involvement of pectoralis/chest wall based on MRI-neoadjuvant therapy could be recommended.  I would recommend neoadjuvant chemotherapy [being aware lobular cancers-did not respond well to chemotherapy.].  However if any response prior to surgery is recommended-I think neoadjuvant chemotherapy would be reasonable.  # Shingles: S/p Valtrex.  Improved.  # genetics- mother- 60 sec to breast cancer; 2 aunts [mom sister]; patient would benefit from genetic evaluation.  Will order next visit.  Thank you Dr.Cornett for allowing me to participate in the care of your pleasant patient. Please do not hesitate to contact me with questions or concerns in the interim.  I left a message for Dr. Brantley Stage to discuss the patient's above plan of care.  # DISPOSITION:  # TBD- Dr.B     All questions were answered. The patient/family knows to call the clinic with any problems, questions or concerns.       Cammie Sickle, MD 08/30/2020 3:07 PM

## 2020-08-29 NOTE — Assessment & Plan Note (Addendum)
#  Lobular invasive breast cancer-MRI breast-approximately 4.5 cm mass right upper quadrant; however concerning for invasion of the pectoralis muscle on imaging.  No evidence of lymph nodes.  # I had a long discussion with the patient in general regarding the treatment options of breast cancer including-surgery; adjuvant radiation; role of adjuvant systemic therapy including-chemotherapy and antihormone therapy.   #Given the involvement of pectoralis/chest wall based on MRI-neoadjuvant therapy could be recommended.  I would recommend neoadjuvant chemotherapy [being aware lobular cancers-did not respond well to chemotherapy.].  However if any response prior to surgery is recommended-I think neoadjuvant chemotherapy would be reasonable.  # Shingles: S/p Valtrex.  Improved.  # genetics- mother- 60 sec to breast cancer; 2 aunts [mom sister]; patient would benefit from genetic evaluation.  Will order next visit.  Thank you Dr.Cornett for allowing me to participate in the care of your pleasant patient. Please do not hesitate to contact me with questions or concerns in the interim.  I left a message for Dr. Brantley Stage to discuss the patient's above plan of care.  # DISPOSITION:  # TBD- Dr.B

## 2020-08-29 NOTE — Telephone Encounter (Signed)
Called dr Brantley Stage office and left message with staff to have Dr. Brantley Stage to call Dr. Rogue Bussing and they can speak about the pt. For a plan of treatment

## 2020-09-01 ENCOUNTER — Telehealth: Payer: Self-pay | Admitting: Internal Medicine

## 2020-09-01 NOTE — Telephone Encounter (Signed)
On 4/01-I spoke to patient regarding my discussion with Dr. Brantley Stage.  Proceed with upfront surgery as planned; decide on adjuvant therapy post surgery.   Discussed with the patient that she could reach to surgeon's office regarding possibility of surgery being done locally at [facility].  Patient understands all her adjuvant therapies can be offered at [facility].  Patient will call us and inform us regarding surgery date; so we could plan postop follow-up.  Anne/Sheena- please add to the next breast conference.   GB

## 2020-09-03 ENCOUNTER — Ambulatory Visit: Payer: Medicare Other | Admitting: Internal Medicine

## 2020-09-03 ENCOUNTER — Other Ambulatory Visit: Payer: Self-pay | Admitting: *Deleted

## 2020-09-03 ENCOUNTER — Other Ambulatory Visit: Payer: Self-pay | Admitting: Surgery

## 2020-09-03 DIAGNOSIS — C50911 Malignant neoplasm of unspecified site of right female breast: Secondary | ICD-10-CM

## 2020-09-03 NOTE — Telephone Encounter (Signed)
Order is in for tumor board.

## 2020-09-10 ENCOUNTER — Encounter: Payer: Self-pay | Admitting: *Deleted

## 2020-09-17 ENCOUNTER — Other Ambulatory Visit: Payer: Self-pay

## 2020-09-17 ENCOUNTER — Encounter (HOSPITAL_BASED_OUTPATIENT_CLINIC_OR_DEPARTMENT_OTHER): Payer: Self-pay | Admitting: Surgery

## 2020-09-21 ENCOUNTER — Other Ambulatory Visit: Payer: Self-pay

## 2020-09-21 ENCOUNTER — Other Ambulatory Visit
Admission: RE | Admit: 2020-09-21 | Discharge: 2020-09-21 | Disposition: A | Discharge status: Home / Self Care | Payer: Medicare Other | Source: Ambulatory Visit | Attending: Surgery | Admitting: Surgery

## 2020-09-21 ENCOUNTER — Other Ambulatory Visit (HOSPITAL_COMMUNITY): Payer: Medicare Other

## 2020-09-21 DIAGNOSIS — Z20822 Contact with and (suspected) exposure to covid-19: Secondary | ICD-10-CM | POA: Insufficient documentation

## 2020-09-21 DIAGNOSIS — C50911 Malignant neoplasm of unspecified site of right female breast: Secondary | ICD-10-CM | POA: Insufficient documentation

## 2020-09-21 LAB — CBC WITH DIFFERENTIAL/PLATELET
Abs Immature Granulocytes: 0.01 10*3/uL (ref 0.00–0.07)
Basophils Absolute: 0.1 10*3/uL (ref 0.0–0.1)
Basophils Relative: 1 %
Eosinophils Absolute: 0.2 10*3/uL (ref 0.0–0.5)
Eosinophils Relative: 4 %
HCT: 35.5 % — ABNORMAL LOW (ref 36.0–46.0)
Hemoglobin: 11.4 g/dL — ABNORMAL LOW (ref 12.0–15.0)
Immature Granulocytes: 0 %
Lymphocytes Relative: 41 %
Lymphs Abs: 1.8 10*3/uL (ref 0.7–4.0)
MCH: 28.3 pg (ref 26.0–34.0)
MCHC: 32.1 g/dL (ref 30.0–36.0)
MCV: 88.1 fL (ref 80.0–100.0)
Monocytes Absolute: 0.5 10*3/uL (ref 0.1–1.0)
Monocytes Relative: 12 %
Neutro Abs: 1.9 10*3/uL (ref 1.7–7.7)
Neutrophils Relative %: 42 %
Platelets: 232 10*3/uL (ref 150–400)
RBC: 4.03 MIL/uL (ref 3.87–5.11)
RDW: 15.9 % — ABNORMAL HIGH (ref 11.5–15.5)
WBC: 4.4 10*3/uL (ref 4.0–10.5)
nRBC: 0 % (ref 0.0–0.2)

## 2020-09-21 LAB — COMPREHENSIVE METABOLIC PANEL
ALT: 13 U/L (ref 0–44)
AST: 20 U/L (ref 15–41)
Albumin: 3.8 g/dL (ref 3.5–5.0)
Alkaline Phosphatase: 65 U/L (ref 38–126)
Anion gap: 6 (ref 5–15)
BUN: 19 mg/dL (ref 8–23)
CO2: 26 mmol/L (ref 22–32)
Calcium: 9.5 mg/dL (ref 8.9–10.3)
Chloride: 106 mmol/L (ref 98–111)
Creatinine, Ser: 0.99 mg/dL (ref 0.44–1.00)
GFR, Estimated: >60 mL/min (ref >60)
Glucose, Bld: 85 mg/dL (ref 70–99)
Potassium: 4.6 mmol/L (ref 3.5–5.1)
Sodium: 138 mmol/L (ref 135–145)
Total Bilirubin: 0.7 mg/dL (ref 0.3–1.2)
Total Protein: 7.7 g/dL (ref 6.5–8.1)

## 2020-09-21 LAB — SARS CORONAVIRUS 2 (TAT 6-24 HRS): SARS Coronavirus 2: NEGATIVE

## 2020-09-24 ENCOUNTER — Ambulatory Visit
Admission: RE | Admit: 2020-09-24 | Discharge: 2020-09-24 | Disposition: A | Discharge status: Home / Self Care | Payer: Medicare Other | Source: Ambulatory Visit | Attending: Surgery | Admitting: Surgery

## 2020-09-24 ENCOUNTER — Other Ambulatory Visit: Payer: Self-pay

## 2020-09-24 ENCOUNTER — Other Ambulatory Visit: Payer: Self-pay | Admitting: Surgery

## 2020-09-24 DIAGNOSIS — C50911 Malignant neoplasm of unspecified site of right female breast: Secondary | ICD-10-CM

## 2020-09-25 ENCOUNTER — Other Ambulatory Visit: Payer: Self-pay

## 2020-09-25 ENCOUNTER — Encounter (HOSPITAL_BASED_OUTPATIENT_CLINIC_OR_DEPARTMENT_OTHER)
Admission: RE | Disposition: A | Discharge status: Home / Self Care | Payer: Self-pay | Source: Home / Self Care | Attending: Surgery

## 2020-09-25 ENCOUNTER — Ambulatory Visit
Admission: RE | Admit: 2020-09-25 | Discharge: 2020-09-25 | Disposition: A | Discharge status: Home / Self Care | Payer: Medicare Other | Source: Ambulatory Visit | Attending: Surgery | Admitting: Surgery

## 2020-09-25 ENCOUNTER — Ambulatory Visit (HOSPITAL_BASED_OUTPATIENT_CLINIC_OR_DEPARTMENT_OTHER): Payer: Medicare Other | Admitting: Anesthesiology

## 2020-09-25 ENCOUNTER — Ambulatory Visit (HOSPITAL_COMMUNITY)
Admission: RE | Admit: 2020-09-25 | Discharge: 2020-09-25 | Disposition: A | Discharge status: Home / Self Care | Payer: Medicare Other | Source: Ambulatory Visit | Attending: Surgery | Admitting: Surgery

## 2020-09-25 ENCOUNTER — Encounter (HOSPITAL_BASED_OUTPATIENT_CLINIC_OR_DEPARTMENT_OTHER): Payer: Self-pay | Admitting: Surgery

## 2020-09-25 ENCOUNTER — Ambulatory Visit (HOSPITAL_BASED_OUTPATIENT_CLINIC_OR_DEPARTMENT_OTHER)
Admission: RE | Admit: 2020-09-25 | Discharge: 2020-09-25 | Disposition: A | Discharge status: Home / Self Care | Payer: Medicare Other | Attending: Surgery | Admitting: Surgery

## 2020-09-25 DIAGNOSIS — E7800 Pure hypercholesterolemia, unspecified: Secondary | ICD-10-CM | POA: Diagnosis not present

## 2020-09-25 DIAGNOSIS — Z809 Family history of malignant neoplasm, unspecified: Secondary | ICD-10-CM | POA: Insufficient documentation

## 2020-09-25 DIAGNOSIS — Z170 Estrogen receptor positive status [ER+]: Secondary | ICD-10-CM | POA: Diagnosis not present

## 2020-09-25 DIAGNOSIS — C50911 Malignant neoplasm of unspecified site of right female breast: Secondary | ICD-10-CM

## 2020-09-25 DIAGNOSIS — C50411 Malignant neoplasm of upper-outer quadrant of right female breast: Secondary | ICD-10-CM | POA: Insufficient documentation

## 2020-09-25 HISTORY — DX: Anxiety disorder, unspecified: F41.9

## 2020-09-25 HISTORY — PX: BREAST LUMPECTOMY WITH RADIOACTIVE SEED AND SENTINEL LYMPH NODE BIOPSY: SHX6550

## 2020-09-25 HISTORY — PX: BREAST LUMPECTOMY: SHX2

## 2020-09-25 SURGERY — BREAST LUMPECTOMY WITH RADIOACTIVE SEED AND SENTINEL LYMPH NODE BIOPSY
Anesthesia: General | Site: Breast | Laterality: Right

## 2020-09-25 MED ORDER — PROPOFOL 10 MG/ML IV BOLUS
INTRAVENOUS | Status: DC | PRN
  Administered 2020-09-25: 110 mg via INTRAVENOUS
  Administered 2020-09-25: 30 mg via INTRAVENOUS

## 2020-09-25 MED ORDER — DEXAMETHASONE SODIUM PHOSPHATE 10 MG/ML IJ SOLN
INTRAMUSCULAR | Status: DC | PRN
  Administered 2020-09-25: 5 mg via INTRAVENOUS

## 2020-09-25 MED ORDER — FENTANYL CITRATE (PF) 100 MCG/2ML IJ SOLN
INTRAMUSCULAR | Status: AC
  Filled 2020-09-25: qty 2

## 2020-09-25 MED ORDER — OXYCODONE HCL 5 MG/5ML PO SOLN
5.0000 mg | Freq: Once | ORAL | Status: DC | PRN
Start: 2020-09-25 — End: 2020-09-25

## 2020-09-25 MED ORDER — CHLORHEXIDINE GLUCONATE CLOTH 2 % EX PADS: 6.0000 | MEDICATED_PAD | Freq: Once | CUTANEOUS | Status: DC

## 2020-09-25 MED ORDER — OXYCODONE HCL 5 MG PO TABS
5.0000 mg | ORAL_TABLET | Freq: Once | ORAL | Status: DC | PRN
Start: 2020-09-25 — End: 2020-09-25

## 2020-09-25 MED ORDER — MIDAZOLAM HCL 2 MG/2ML IJ SOLN
INTRAMUSCULAR | Status: AC
  Filled 2020-09-25: qty 2

## 2020-09-25 MED ORDER — ONDANSETRON HCL 4 MG/2ML IJ SOLN
INTRAMUSCULAR | Status: DC | PRN
  Administered 2020-09-25: 4 mg via INTRAVENOUS

## 2020-09-25 MED ORDER — PROMETHAZINE HCL 25 MG/ML IJ SOLN: 6.2500 mg | INTRAMUSCULAR | Status: DC | PRN

## 2020-09-25 MED ORDER — MIDAZOLAM HCL 2 MG/2ML IJ SOLN
1.0000 mg | Freq: Once | INTRAMUSCULAR | Status: AC
  Administered 2020-09-25: 1 mg via INTRAVENOUS

## 2020-09-25 MED ORDER — OXYCODONE HCL 5 MG PO TABS: 5.0000 mg | ORAL_TABLET | Freq: Four times a day (QID) | ORAL | 0 refills | Status: DC | PRN

## 2020-09-25 MED ORDER — CEFAZOLIN SODIUM-DEXTROSE 2-4 GM/100ML-% IV SOLN
2.0000 g | INTRAVENOUS | Status: AC
  Administered 2020-09-25: 2 g via INTRAVENOUS

## 2020-09-25 MED ORDER — TECHNETIUM TC 99M TILMANOCEPT KIT
1.0000 | PACK | Freq: Once | INTRAVENOUS | Status: AC | PRN
  Administered 2020-09-25: 1 via INTRADERMAL

## 2020-09-25 MED ORDER — LACTATED RINGERS IV SOLN: INTRAVENOUS | Status: DC

## 2020-09-25 MED ORDER — CEFAZOLIN SODIUM-DEXTROSE 2-4 GM/100ML-% IV SOLN
INTRAVENOUS | Status: AC
  Filled 2020-09-25: qty 100

## 2020-09-25 MED ORDER — ACETAMINOPHEN 500 MG PO TABS
1000.0000 mg | ORAL_TABLET | ORAL | Status: AC
  Administered 2020-09-25: 1000 mg via ORAL

## 2020-09-25 MED ORDER — LIDOCAINE 2% (20 MG/ML) 5 ML SYRINGE
INTRAMUSCULAR | Status: AC
  Filled 2020-09-25: qty 5

## 2020-09-25 MED ORDER — FENTANYL CITRATE (PF) 100 MCG/2ML IJ SOLN: 25.0000 ug | INTRAMUSCULAR | Status: DC | PRN

## 2020-09-25 MED ORDER — SODIUM CHLORIDE 0.9 % IV SOLN
INTRAVENOUS | Status: AC
  Filled 2020-09-25: qty 10

## 2020-09-25 MED ORDER — ONDANSETRON HCL 4 MG/2ML IJ SOLN
INTRAMUSCULAR | Status: AC
  Filled 2020-09-25: qty 2

## 2020-09-25 MED ORDER — BUPIVACAINE-EPINEPHRINE (PF) 0.5% -1:200000 IJ SOLN
INTRAMUSCULAR | Status: DC | PRN
  Administered 2020-09-25: 20 mL via PERINEURAL

## 2020-09-25 MED ORDER — FENTANYL CITRATE (PF) 100 MCG/2ML IJ SOLN
INTRAMUSCULAR | Status: DC | PRN
  Administered 2020-09-25: 50 ug via INTRAVENOUS
  Administered 2020-09-25 (×2): 25 ug via INTRAVENOUS

## 2020-09-25 MED ORDER — ACETAMINOPHEN 500 MG PO TABS
ORAL_TABLET | ORAL | Status: AC
  Filled 2020-09-25: qty 2

## 2020-09-25 MED ORDER — VANCOMYCIN HCL 500 MG IV SOLR
INTRAVENOUS | Status: DC | PRN
  Administered 2020-09-25: 500 mg via TOPICAL

## 2020-09-25 MED ORDER — LIDOCAINE HCL (CARDIAC) PF 100 MG/5ML IV SOSY
PREFILLED_SYRINGE | INTRAVENOUS | Status: DC | PRN
  Administered 2020-09-25: 80 mg via INTRATRACHEAL

## 2020-09-25 MED ORDER — SODIUM CHLORIDE 0.9 % IV SOLN
INTRAVENOUS | Status: DC | PRN
  Administered 2020-09-25: 500 mL

## 2020-09-25 MED ORDER — VANCOMYCIN HCL 500 MG IV SOLR
INTRAVENOUS | Status: AC
  Filled 2020-09-25: qty 500

## 2020-09-25 MED ORDER — PROPOFOL 10 MG/ML IV BOLUS
INTRAVENOUS | Status: AC
  Filled 2020-09-25: qty 20

## 2020-09-25 MED ORDER — CEFAZOLIN IN SODIUM CHLORIDE 3-0.9 GM/100ML-% IV SOLN
3.0000 g | INTRAVENOUS | Status: DC
  Filled 2020-09-25: qty 100

## 2020-09-25 MED ORDER — IBUPROFEN 800 MG PO TABS: 800.0000 mg | ORAL_TABLET | Freq: Three times a day (TID) | ORAL | 0 refills | Status: DC | PRN

## 2020-09-25 MED ORDER — FENTANYL CITRATE (PF) 100 MCG/2ML IJ SOLN
50.0000 ug | Freq: Once | INTRAMUSCULAR | Status: AC
  Administered 2020-09-25: 50 ug via INTRAVENOUS

## 2020-09-25 MED ORDER — BUPIVACAINE-EPINEPHRINE (PF) 0.25% -1:200000 IJ SOLN
INTRAMUSCULAR | Status: DC | PRN
  Administered 2020-09-25: 18 mL

## 2020-09-25 MED ORDER — DEXAMETHASONE SODIUM PHOSPHATE 10 MG/ML IJ SOLN
INTRAMUSCULAR | Status: AC
  Filled 2020-09-25: qty 1

## 2020-09-25 MED ORDER — BUPIVACAINE LIPOSOME 1.3 % IJ SUSP
INTRAMUSCULAR | Status: DC | PRN
  Administered 2020-09-25: 10 mL via PERINEURAL

## 2020-09-25 SURGICAL SUPPLY — 51 items
ADH SKN CLS APL DERMABOND .7 (GAUZE/BANDAGES/DRESSINGS) ×1
APL PRP STRL LF DISP 70% ISPRP (MISCELLANEOUS) ×1
APPLIER CLIP 9.375 MED OPEN (MISCELLANEOUS) ×2
APR CLP MED 9.3 20 MLT OPN (MISCELLANEOUS) ×1
BINDER BREAST LRG (GAUZE/BANDAGES/DRESSINGS) ×2 IMPLANT
BIOPATCH RED 1 DISK 7.0 (GAUZE/BANDAGES/DRESSINGS) ×2 IMPLANT
BLADE SURG 15 STRL LF DISP TIS (BLADE) ×1 IMPLANT
BLADE SURG 15 STRL SS (BLADE) ×2
CANISTER SUCT 1200ML W/VALVE (MISCELLANEOUS) ×2 IMPLANT
CHLORAPREP W/TINT 26 (MISCELLANEOUS) ×2 IMPLANT
CLIP APPLIE 9.375 MED OPEN (MISCELLANEOUS) ×1 IMPLANT
COVER BACK TABLE 60X90IN (DRAPES) ×2 IMPLANT
COVER MAYO STAND STRL (DRAPES) ×2 IMPLANT
COVER PROBE W GEL 5X96 (DRAPES) ×2 IMPLANT
DERMABOND ADVANCED (GAUZE/BANDAGES/DRESSINGS) ×1
DERMABOND ADVANCED .7 DNX12 (GAUZE/BANDAGES/DRESSINGS) ×1 IMPLANT
DRAIN CHANNEL 19F RND (DRAIN) ×2 IMPLANT
DRAPE LAPAROSCOPIC ABDOMINAL (DRAPES) ×2 IMPLANT
DRAPE UTILITY XL STRL (DRAPES) ×2 IMPLANT
DRSG TEGADERM 2-3/8X2-3/4 SM (GAUZE/BANDAGES/DRESSINGS) ×2 IMPLANT
ELECT COATED BLADE 2.86 ST (ELECTRODE) ×2 IMPLANT
ELECT REM PT RETURN 9FT ADLT (ELECTROSURGICAL) ×2
ELECTRODE REM PT RTRN 9FT ADLT (ELECTROSURGICAL) ×1 IMPLANT
EVACUATOR SILICONE 100CC (DRAIN) ×2 IMPLANT
GLOVE SRG 8 PF TXTR STRL LF DI (GLOVE) ×2 IMPLANT
GLOVE SURG ENC MOIS LTX SZ6.5 (GLOVE) ×2 IMPLANT
GLOVE SURG ENC MOIS LTX SZ7.5 (GLOVE) ×2 IMPLANT
GLOVE SURG LTX SZ8 (GLOVE) ×2 IMPLANT
GLOVE SURG UNDER POLY LF SZ7 (GLOVE) ×2 IMPLANT
GLOVE SURG UNDER POLY LF SZ8 (GLOVE) ×4
GOWN STRL REUS W/ TWL LRG LVL3 (GOWN DISPOSABLE) ×2 IMPLANT
GOWN STRL REUS W/ TWL XL LVL3 (GOWN DISPOSABLE) ×1 IMPLANT
GOWN STRL REUS W/TWL LRG LVL3 (GOWN DISPOSABLE) ×4
GOWN STRL REUS W/TWL XL LVL3 (GOWN DISPOSABLE) ×2
HEMOSTAT ARISTA ABSORB 3G PWDR (HEMOSTASIS) ×2 IMPLANT
HEMOSTAT SNOW SURGICEL 2X4 (HEMOSTASIS) IMPLANT
KIT MARKER MARGIN INK (KITS) ×2 IMPLANT
NEEDLE HYPO 25X1 1.5 SAFETY (NEEDLE) ×2 IMPLANT
NS IRRIG 1000ML POUR BTL (IV SOLUTION) IMPLANT
PACK BASIN DAY SURGERY FS (CUSTOM PROCEDURE TRAY) ×2 IMPLANT
PENCIL SMOKE EVACUATOR (MISCELLANEOUS) ×2 IMPLANT
SLEEVE SCD COMPRESS KNEE MED (STOCKING) ×2 IMPLANT
SPONGE LAP 4X18 RFD (DISPOSABLE) ×4 IMPLANT
SUT ETHILON 2 0 FS 18 (SUTURE) ×2 IMPLANT
SUT MNCRL AB 4-0 PS2 18 (SUTURE) ×2 IMPLANT
SUT VICRYL 3-0 CR8 SH (SUTURE) ×2 IMPLANT
SYR CONTROL 10ML LL (SYRINGE) ×2 IMPLANT
TOWEL GREEN STERILE FF (TOWEL DISPOSABLE) ×2 IMPLANT
TRAY FAXITRON CT DISP (TRAY / TRAY PROCEDURE) ×2 IMPLANT
TUBE CONNECTING 20X1/4 (TUBING) ×2 IMPLANT
YANKAUER SUCT BULB TIP NO VENT (SUCTIONS) ×2 IMPLANT

## 2020-09-25 NOTE — Op Note (Signed)
Preoperative diagnosis: Stage II right breast cancer upper outer quadrant  Postoperative diagnosis: Same  Procedure: Right breast seed localized lumpectomy with right axillary sentinel lymph node mapping  Surgeon: Erroll Luna, MD  Assistant: Dr. Wonda Cerise, MD  Anesthesia: General with pectoral block local anesthetic of 0.25% Marcaine plain  Drains: 19 round drain to lumpectomy and axillary cavity  Specimen: Right breast tissue with seed and clip verified by Faxitron and 3 right axillar sentinel nodes.  3 additional margins taken and labeled and oriented with ink as was the primary specimen.  IV fluids: Per anesthesia record  Indications for procedure: The patient is a 70 year old female seen in the multidisciplinary clinic for a right breast cancer.  This was an invasive lobular type.  This was adherent to the pectoralis muscle.  We discussed options of neoadjuvant versus primary surgical resection.  Given her breast size and the fact that neoadjuvant would take some time, the patient opted for initial lumpectomy followed by adjuvant therapy.  She was also seen by radiation oncology.  All options were discussed with the patient and she opted for breast conserving surgery.The procedure has been discussed with the patient. Alternatives to surgery have been discussed with the patient.  Risks of surgery include bleeding,  Infection,  Seroma formation, death,  and the need for further surgery.   The patient understands and wishes to proceed.Sentinel lymph node mapping and dissection has been discussed with the patient.  Risk of bleeding,  Infection,  Seroma formation,  Additional procedures,,  Shoulder weakness ,  Shoulder stiffness,  Nerve and blood vessel injury and reaction to the mapping dyes have been discussed.  Alternatives to surgery have been discussed with the patient.  The patient agrees to proceed.  Description of procedure: The patient was met in the holding area and questions were  answered.  She had a seed placed by radiology yesterday.  She had a pectoral block placed per anesthesia and the neoprobe was used to verify seed location in the right breast.  This marked as the correct site.  She underwent injection with technetium sulfur colloid per radiology protocol.  All questions were answered.  The procedure was reviewed.  She was taken back to the operative room.  She is placed supine upon the OR table.  After induction of general anesthesia, the right breast was prepped and draped in sterile fashion and timeout performed.  Proper patient, site and procedure were verified.  Neoprobe used to identify the mass in the right breast upper outer quadrant.  Curvilinear incision was made in the right breast upper outer quadrant.  Dissection was carried down.  The tumor is felt to be adherent to the pectoralis major muscle.  The entire tumor was excised the margins appeared grossly negative.  I did take some of the muscle with the tumor on the deep part of the tumor.  I then excised all margins and sent them separately.  All margins were inked.  Image revealed seed and clip to be in specimen.  Irrigation was used and hemostasis achieved.  Neoprobe settings were changed technetium.  There were 3 hot node identified in the level 1 basin.  These were all taken.  Background counts approached baseline.  Hemostasis achieved.  The axillary vein, long thoracic nerve and thoracodorsal trunk were preserved.  After irrigation with antibiotic solution Arista was placed.  Vancomycin powder then placed.  Through separate stab incision 19 round drain placed.  Hemostasis achieved.  Wound closed with 3-0 Vicryl and  4 Monocryl.  Dermabond applied.  All counts found to be correct.  The patient was awoke extubated taken to recovery in satisfactory condition.  Breast binder placed.

## 2020-09-25 NOTE — H&P (Signed)
Cheryl Cheryl [name]  Location: [facility] Patient #: [id number] DOB: 03-06-1951 Single / Language: Cheryl Cheryl [name] / Race: Black or African American Female  History of Present Illness  Patient words: Patient sent for evaluation of abnormal right breast mammogram. She has been followed for distortion in her right breast upper outer quadrant. On her most recent imaging, Cheryl [name] 2.3 cm area DISTORTION was noted. 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 She had Cheryl [name] mother and 2 maternal aunts with breast cancer. This is her first breast biopsy. Patient denies any history of breast pain nipple discharge or breast pain.   CLINICAL DATA: 70 year old female presenting as Cheryl [name] recall from screening for possible right breast distortion.  EXAM: DIGITAL DIAGNOSTIC RIGHT MAMMOGRAM WITH TOMO  ULTRASOUND RIGHT BREAST  COMPARISON: Previous exam(s).  ACR Breast Density Category c: The breast tissue is heterogeneously dense, which may obscure small masses.  FINDINGS: Mammogram:  Spot compression tomosynthesis as well as full field mL tomosynthesis views of the right breast were performed demonstrating persistence of an asymmetry with possible subtle distortion on the ML and spot MLO views in the superior right breast far posterior depth. No definite correlate identified on the CC view.  Ultrasound:  Targeted ultrasound is performed in the right breast at 10 o'clock 6 cm from the nipple posterior depth demonstrating Cheryl [name] focal hypoechoic area with echogenic halo measuring 2.3 x 1.3 x 1.6 cm. This likely corresponds to the asymmetry seen mammographically.  Targeted ultrasound of the right axilla demonstrates normal-appearing lymph nodes.  IMPRESSION: Right breast asymmetry with possible subtle distortion. There is Cheryl [name] likely sonographic correlate at 10 o'clock in the right  breast.  RECOMMENDATION: Ultrasound-guided core needle biopsy of the right breast at 10 o'clock. Recommend close attention on post procedure mammogram to ensure correlation between the mammographic and sonographic findings.  I have discussed the findings and recommendations with the patient who agrees to proceed with biopsy. If applicable, Cheryl [name] reminder letter will be sent to the patient regarding the next appointment.  BI-RADS CATEGORY 4: Suspicious.   Electronically Signed By: Cheryl Cheryl [name] M.D. On: 05/22/2020 09:48              REASON FOR ADDENDUM, AMENDMENT OR CORRECTION: [id number]: E-cadherin results. kh 08/06/20 02:52:37 PM ADDITIONAL INFORMATION: PROGNOSTIC INDICATORS Results: IMMUNOHISTOCHEMICAL AND MORPHOMETRIC ANALYSIS PERFORMED MANUALLY The tumor cells are NEGATIVE for Her2 (1+). Estrogen Receptor: 95%, POSITIVE, STRONG-MODERATE STAINING INTENSITY Progesterone Receptor: 5%, POSITIVE, MODERATE STAINING INTENSITY Proliferation Marker Ki67: 2% REFERENCE RANGE ESTROGEN RECEPTOR NEGATIVE 0% POSITIVE =>1% REFERENCE RANGE PROGESTERONE RECEPTOR NEGATIVE 0% POSITIVE =>1% All controls stained appropriately Cheryl Sheller MD Pathologist, Electronic Signature ( Signed 08/09/2020) FINAL DIAGNOSIS Diagnosis Breast, right, needle core biopsy, 10 o'clock, 6 cmfn - INVASIVE MAMMARY CARCINOMA. 1 of 3 Amended copy Addendum FINAL for Cheryl Cheryl [name], Cheryl Cheryl [name] ([id number]) Microscopic Comment The carcinoma appears grade 2. The greatest linear extent of tumor in any one core is 17 mm. E-cadherin will be reported separately. Ancillary studies will be reported separately. Results reported to [facility] on 08/06/2020. Cheryl Cheryl [name] reviewed the case. ADDENDUM: Immunohistochemistry for E-cadherin is negative supporting Cheryl [name] lobular phenotype. Cheryl Manners MD Pathologist, Electronic Signature (Case signed 08/06/2020).  The  patient is Cheryl [name] 70 year old female.   Past Surgical History No pertinent past surgical history  Diagnostic Studies History (Cheryl Cheryl [name] Colonoscopy within last year  Allergies Cheryl Cheryl [name],  No Known Drug Allergies [08/17/2020]: Allergies Reconciled  Medication History  Alendronate Sodium (70MG Tablet, Oral) Active. Cephalexin (500MG Capsule, Oral) Active. [employer] Purelax (17GM/SCOOP Powder, Oral) Active. hydrOXYzine HCl (10MG Tablet, Oral) Active. Escitalopram Oxalate (5MG Tablet, Oral) Active. Rosuvastatin Calcium (10MG Tablet, Oral) Active. Suprep Bowel Prep Kit (17.5-3.13-1.6GM/177ML Solution, Oral) Active. Medications Reconciled  Social History (No drug use  Family History ( Cancer Family Members In General. Heart Disease Family Members In General.  Pregnancy / Birth History  Maternal age 50-30 Para 1  Other Problems ( Hypercholesterolemia     Review of Systems Skin Present- Rash. Not Present- Change in Wart/Mole, Dryness, Hives, Jaundice, New Lesions, Non-Healing Wounds and Ulcer.  Vitals  08/17/2020 9:28 AM Weight: 141.13 lb Height: 67in Body Surface Area: 1.74 m Body Mass Index: 22.1 kg/m  Temp.: 97.43F  Pulse: 99 (Regular)  P.OX: 97% (Room air) BP: 130/80(Sitting, Left Arm, Standard)        Physical Exam   General Mental Status-Alert. General Appearance-Consistent with stated age. Hydration-Well hydrated. Voice-Normal.  Head and Neck Head-normocephalic, atraumatic with no lesions or palpable masses. Trachea-midline. Thyroid Gland Characteristics - normal size and consistency.  Eye Eyeball - Bilateral-Extraocular movements intact. Sclera/Conjunctiva - Bilateral-No scleral icterus.  Chest and Lung Exam Chest and lung exam reveals -quiet, even and easy respiratory effort with no use of accessory muscles and on auscultation, normal breath sounds, no adventitious  sounds and normal vocal resonance. Inspection Chest Wall - Normal. Back - normal.  Breast Breast - Left-Symmetric, Non Tender, No Biopsy scars, no Dimpling - Left, No Inflammation, No Lumpectomy scars, No Mastectomy scars, No Peau d' Orange. Breast - Right-Symmetric, Non Tender, No Biopsy scars, no Dimpling - Right, No Inflammation, No Lumpectomy scars, No Mastectomy scars, No Peau d' Orange. Breast Lump-No Palpable Breast Mass.  Cardiovascular Cardiovascular examination reveals -normal heart sounds, regular rate and rhythm with no murmurs and normal pedal pulses bilaterally.  Abdomen Inspection Inspection of the abdomen reveals - No Hernias. Skin - Scar - no surgical scars. Palpation/Percussion Palpation and Percussion of the abdomen reveal - Soft, Non Tender, No Rebound tenderness, No Rigidity (guarding) and No hepatosplenomegaly. Auscultation Auscultation of the abdomen reveals - Bowel sounds normal.  Neurologic Neurologic evaluation reveals -alert and oriented x 3 with no impairment of recent or remote memory. Mental Status-Normal.  Musculoskeletal Normal Exam - Left-Upper Extremity Strength Normal and Lower Extremity Strength Normal. Normal Exam - Right-Upper Extremity Strength Normal and Lower Extremity Strength Normal.  Lymphatic Head & Neck  General Head & Neck Lymphatics: Bilateral - Description - Normal. Axillary  General Axillary Region: Bilateral - Description - Normal. Tenderness - Non Tender. Femoral & Inguinal  Generalized Femoral & Inguinal Lymphatics: Bilateral - Description - Normal. Tenderness - Non Tender.    Assessment & Plan   BREAST CANCER, RIGHT (C50.911) Impression: ILC-low-grade-stage II  Recommendation magnetic resonance imaging Discussed breast conserving surgery versus mastectomy. She is Cheryl [name] good breast conserving candidate and recommended right breast seed localized lumpectomy with right axillary sentinel lymph  node mapping. This may change depending on magnetic resonance imaging findings. Discussed potential reexcision issues with invasive lobular carcinoma and the rationale for obtaining an magnetic resonance imaging. Discussed sentinel lymph node mapping and the pros and cons of this as well today. Discussed mastectomy and reconstruction. Refer to medical and radiation oncology, genetics and obtain magnetic resonance imaging. Plan breast conserving surgery unless something else changes in her workup.

## 2020-09-25 NOTE — Anesthesia Preprocedure Evaluation (Addendum)
Anesthesia Evaluation  Patient identified by MRN, date of birth, ID band Patient awake    Reviewed: Allergy & Precautions, NPO status , Patient's Chart, lab work & pertinent test results  History of Anesthesia Complications Negative for: history of anesthetic complications  Airway Mallampati: II  TM Distance: >3 FB Neck ROM: Full    Dental  (+) Missing,    Pulmonary neg pulmonary ROS,    Pulmonary exam normal        Cardiovascular negative cardio ROS Normal cardiovascular exam     Neuro/Psych Anxiety negative neurological ROS     GI/Hepatic negative GI ROS, Neg liver ROS,   Endo/Other  negative endocrine ROS  Renal/GU negative Renal ROS  negative genitourinary   Musculoskeletal negative musculoskeletal ROS (+)   Abdominal   Peds  Hematology Hgb 11.4   Anesthesia Other Findings Right breast ca  Reproductive/Obstetrics negative OB ROS                            Anesthesia Physical Anesthesia Plan  ASA: II  Anesthesia Plan: General   Post-op Pain Management: GA combined w/ Regional for post-op pain   Induction: Intravenous  PONV Risk Score and Plan: 3 and Treatment may vary due to age or medical condition, Ondansetron, Dexamethasone and Midazolam  Airway Management Planned: LMA  Additional Equipment: None  Intra-op Plan:   Post-operative Plan: Extubation in OR  Informed Consent: I have reviewed the patients History and Physical, chart, labs and discussed the procedure including the risks, benefits and alternatives for the proposed anesthesia with the patient or authorized representative who has indicated his/her understanding and acceptance.     Dental advisory given  Plan Discussed with: CRNA  Anesthesia Plan Comments:        Anesthesia Quick Evaluation

## 2020-09-25 NOTE — Transfer of Care (Signed)
Immediate Anesthesia Transfer of Care Note  Patient: Cheryl [name]  Procedure(s) Performed: RIGHT BREAST LUMPECTOMY WITH RADIOACTIVE SEED AND RIGHT SENTINEL LYMPH NODE BIOPSY (Right Breast)  Patient Location: PACU  Anesthesia Type:General  Level of Consciousness: drowsy, patient cooperative and responds to stimulation  Airway & Oxygen Therapy: Patient Spontanous Breathing and Patient connected to face mask oxygen  Post-op Assessment: Report given to RN and Post -op Vital signs reviewed and stable  Post vital signs: Reviewed and stable  Last Vitals:  Vitals Value Taken Time  BP 132/77 09/25/20 0851  Temp    Pulse 100 09/25/20 0851  Resp 20 09/25/20 0851  SpO2 100 % 09/25/20 0851  Vitals shown include unvalidated device data.  Last Pain:  Vitals:   09/25/20 0643  TempSrc: Oral  PainSc: 0-No pain         Complications: No complications documented.

## 2020-09-25 NOTE — Anesthesia Procedure Notes (Signed)
Procedure Name: LMA Insertion Date/Time: 09/25/2020 7:37 AM Performed by: Glory Buff, CRNA Pre-anesthesia Checklist: Patient identified, Emergency Drugs available, Suction available and Patient being monitored Patient Re-evaluated:Patient Re-evaluated prior to induction Oxygen Delivery Method: Circle system utilized Preoxygenation: Pre-oxygenation with 100% oxygen Induction Type: IV induction Ventilation: Mask ventilation without difficulty LMA: LMA inserted LMA Size: 4.0 Number of attempts: 1 Placement Confirmation: positive ETCO2 Tube secured with: Tape Dental Injury: Teeth and Oropharynx as per pre-operative assessment

## 2020-09-25 NOTE — Progress Notes (Signed)
Assisted nuc med tech with nuc med injections. Side rails up, monitors on throughout procedure. See vital signs in flow sheet. Tolerated Procedure well. 

## 2020-09-25 NOTE — Progress Notes (Signed)
Assisted Dr. Daiva Huge with right, ultrasound guided, pectoralis block. Side rails up, monitors on throughout procedure. See vital signs in flow sheet. Tolerated Procedure well.

## 2020-09-25 NOTE — Discharge Instructions (Signed)
Surgical Drain Home Care Surgical drains are used to remove extra fluid that normally builds up in a surgical wound after surgery. A surgical drain helps to heal a surgical wound. Different kinds of surgical drains include:  Active drains. These drains use suction to pull drainage away from the surgical wound. Drainage flows through a tube to a container outside of the body. With these drains, you need to keep the bulb or the drainage container flat (compressed) at all times, except while you empty it. Flattening the bulb or container creates suction.  Passive drains. These drains allow fluid to drain naturally, by gravity. Drainage flows through a tube to a bandage (dressing) or a container outside of the body. Passive drains do not need to be emptied. A drain is placed during surgery. Right after surgery, drainage is usually bright red and a little thicker than water. The drainage may gradually turn yellow or pink and become thinner. It is likely that your health care provider will remove the drain when the drainage stops or when the amount decreases to 1-2 Tbsp (15-30 mL) during a 24-hour period. Supplies needed:  Tape.  Germ-free cleaning solution (sterile saline).  Cotton swabs.  Split gauze drain sponge: 4 x 4 inches (10 x 10 cm).  Gauze square: 4 x 4 inches (10 x 10 cm). How to care for your surgical drain Care for your drain as told by your health care provider. This is important to help prevent infection. If your drain is placed at your back, or any other hard-to-reach area, ask another person to assist you in performing the following tasks: General care  Keep the skin around the drain dry and covered with a dressing at all times.  Check your drain area every day for signs of infection. Check for: ? Redness, swelling, or pain. ? Pus or a bad smell. ? Cloudy drainage. ? Tenderness or pressure at the drain exit site. Changing the dressing Follow instructions from your health care  provider about how to change your dressing. Change your dressing at least once a day. Change it more often if needed to keep the dressing dry. Make sure you: 1. Gather your supplies. 2. Wash your hands with soap and water before you change your dressing. If soap and water are not available, use hand sanitizer. 3. Remove the old dressing. Avoid using scissors to do that. 4. Wash your hands with soap and water again after removing the old dressing. 5. Use sterile saline to clean your skin around the drain. You may need to use a cotton swab to clean the skin. 6. Place the tube through the slit in a drain sponge. Place the drain sponge so that it covers your wound. 7. Place the gauze square or another drain sponge on top of the drain sponge that is on the wound. Make sure the tube is between those layers. 8. Tape the dressing to your skin. 9. Tape the drainage tube to your skin 1-2 inches (2.5-5 cm) below the place where the tube enters your body. Taping keeps the tube from pulling on any stitches (sutures) that you have. 10. Wash your hands with soap and water. 11. Write down the color of your drainage and how often you change your dressing. How to empty your active drain 1. Make sure that you have a measuring cup that you can empty your drainage into. 2. Wash your hands with soap and water. If soap and water are not available, use hand sanitizer. 3. Loosen   any pins or clips that hold the tube in place. 4. If your health care provider tells you to strip the tube to prevent clots and tube blockages: ? Hold the tube at the skin with one hand. Use your other hand to pinch the tubing with your thumb and first finger. ? Gently move your fingers down the tube while squeezing very lightly. This clears any drainage, clots, or tissue from the tube. ? You may need to do this several times each day to keep the tube clear. Do not pull on the tube. 5. Open the bulb cap or the drain plug. Do not touch the inside  of the cap or the bottom of the plug. 6. Turn the device upside down and gently squeeze. 7. Empty all of the drainage into the measuring cup. 8. Compress the bulb or the container and replace the cap or the plug. To compress the bulb or the container, squeeze it firmly in the middle while you close the cap or plug the container. 9. Write down the amount of drainage that you have in each 24-hour period. If you have less than 2 Tbsp (30 mL) of drainage during 24 hours, contact your health care provider. 10. Flush the drainage down the toilet. 11. Wash your hands with soap and water.   Contact a health care provider if:  You have redness, swelling, or pain around your drain area.  You have pus or a bad smell coming from your drain area.  You have a fever or chills.  The skin around your drain is warm to the touch.  The amount of drainage that you have is increasing instead of decreasing.  You have drainage that is cloudy.  There is a sudden stop or a sudden decrease in the amount of drainage that you have.  Your drain tube falls out.  Your active drain does not stay compressed after you empty it. Summary  Surgical drains are used to remove extra fluid that normally builds up in a surgical wound after surgery.  Different kinds of surgical drains include active drains and passive drains. Active drains use suction to pull drainage away from the surgical wound, and passive drains allow fluid to drain naturally.  It is important to care for your drain to prevent infection. If your drain is placed at your back, or any other hard-to-reach area, ask another person to assist you.  Contact your health care provider if you have redness, swelling, or pain around your drain area. This information is not intended to replace advice given to you by your health care provider. Make sure you discuss any questions you have with your health care provider. Document Revised: 06/23/2018 Document Reviewed:  06/23/2018 Elsevier Patient Education  2021 [place] Office Phone Number [phone]  BREAST BIOPSY/ PARTIAL MASTECTOMY: POST OP INSTRUCTIONS  Always review your discharge instruction sheet given to you by the facility where your surgery was performed.  IF YOU HAVE DISABILITY OR FAMILY LEAVE FORMS, YOU MUST BRING THEM TO THE OFFICE FOR PROCESSING.  DO NOT GIVE THEM TO YOUR DOCTOR.  1. A prescription for pain medication may be given to you upon discharge.  Take your pain medication as prescribed, if needed.  If narcotic pain medicine is not needed, then you may take acetaminophen (Tylenol) or ibuprofen (Advil) as needed. 2. Take your usually prescribed medications unless otherwise directed 3. If you need a refill on your pain medication, please contact your pharmacy.  They will contact  our office to request authorization.  Prescriptions will not be filled after 5pm or on week-ends. 4. You should eat very light the first 24 hours after surgery, such as soup, crackers, pudding, etc.  Resume your normal diet the day after surgery. 5. Most patients will experience some swelling and bruising in the breast.  Ice packs and a good support bra will help.  Swelling and bruising can take several days to resolve.  6. It is common to experience some constipation if taking pain medication after surgery.  Increasing fluid intake and taking a stool softener will usually help or prevent this problem from occurring.  A mild laxative (Milk of Magnesia or Miralax) should be taken according to package directions if there are no bowel movements after 48 hours. 7. Unless discharge instructions indicate otherwise, you may remove your bandages 24-48 hours after surgery, and you may shower at that time.  You may have steri-strips (small skin tapes) in place directly over the incision.  These strips should be left on the skin for 7-10 days.  If your surgeon used skin glue on the incision, you  may shower in 24 hours.  The glue will flake off over the next 2-3 weeks.  Any sutures or staples will be removed at the office during your follow-up visit. 8. ACTIVITIES:  You may resume regular daily activities (gradually increasing) beginning the next day.  Wearing a good support bra or sports bra minimizes pain and swelling.  You may have sexual intercourse when it is comfortable. a. You may drive when you no longer are taking prescription pain medication, you can comfortably wear a seatbelt, and you can safely maneuver your car and apply brakes. b. RETURN TO WORK:  ______________________________________________________________________________________ 9. You should see your doctor in the office for a follow-up appointment approximately two weeks after your surgery.  Your doctor's nurse will typically make your follow-up appointment when she calls you with your pathology report.  Expect your pathology report 2-3 business days after your surgery.  You may call to check if you do not hear from [place] after three days. 10. OTHER INSTRUCTIONS: _______________________________________________________________________________________________ _____________________________________________________________________________________________________________________________________ _____________________________________________________________________________________________________________________________________ _____________________________________________________________________________________________________________________________________  WHEN TO CALL YOUR DOCTOR: 1. Fever over 101.0 2. Nausea and/or vomiting. 3. Extreme swelling or bruising. 4. Continued bleeding from incision. 5. Increased pain, redness, or drainage from the incision.  The clinic staff is available to answer your questions during regular business hours.  Please don't hesitate to call and ask to speak to one of the nurses for clinical  concerns.  If you have a medical emergency, go to the nearest emergency room or call 911.  A surgeon from [facility] is always on call at the hospital.  For further questions, please visit [email or web address]   May have Tylenol after 12:45pm today, if needed.  Post Anesthesia Home Care Instructions  Activity: Get plenty of rest for the remainder of the day. A responsible individual must stay with you for 24 hours following the procedure.  For the next 24 hours, DO NOT: -Drive a car -Paediatric nurse -Drink alcoholic beverages -Take any medication unless instructed by your physician -Make any legal decisions or sign important papers.  Meals: Start with liquid foods such as gelatin or soup. Progress to regular foods as tolerated. Avoid greasy, spicy, heavy foods. If nausea and/or vomiting occur, drink only clear liquids until the nausea and/or vomiting subsides. Call your physician if vomiting continues.  Special Instructions/Symptoms: Your throat may feel dry or sore from the anesthesia  or the breathing tube placed in your throat during surgery. If this causes discomfort, gargle with warm salt water. The discomfort should disappear within 24 hours.  If you had a scopolamine patch placed behind your ear for the management of post- operative nausea and/or vomiting:  1. The medication in the patch is effective for 72 hours, after which it should be removed.  Wrap patch in a tissue and discard in the trash. Wash hands thoroughly with soap and water. 2. You may remove the patch earlier than 72 hours if you experience unpleasant side effects which may include dry mouth, dizziness or visual disturbances. 3. Avoid touching the patch. Wash your hands with soap and water after contact with the patch.    Information for Discharge Teaching: EXPAREL (bupivacaine liposome injectable suspension)   Your surgeon or anesthesiologist gave you EXPAREL(bupivacaine) to help control  your pain after surgery.   EXPAREL is a local anesthetic that provides pain relief by numbing the tissue around the surgical site.  EXPAREL is designed to release pain medication over time and can control pain for up to 72 hours.  Depending on how you respond to EXPAREL, you may require less pain medication during your recovery.  Possible side effects:  Temporary loss of sensation or ability to move in the area where bupivacaine was injected.  Nausea, vomiting, constipation  Rarely, numbness and tingling in your mouth or lips, lightheadedness, or anxiety may occur.  Call your doctor right away if you think you may be experiencing any of these sensations, or if you have other questions regarding possible side effects.  Follow all other discharge instructions given to you by your surgeon or nurse. Eat a healthy diet and drink plenty of water or other fluids.  If you return to the hospital for any reason within 96 hours following the administration of EXPAREL, it is important for health care providers to know that you have received this anesthetic. A teal colored band has been placed on your arm with the date, time and amount of EXPAREL you have received in order to alert and inform your health care providers. Please leave this armband in place for the full 96 hours following administration, and then you may remove the band.     JP Drain [employer] this sheet to all of your post-operative appointments while you have your drains.  Please measure your drains by CC's or ML's.  Make sure you drain and measure your JP Drains 2 or 3 times per day.  At the end of each day, add up totals for the left side and add up totals for the right side.    ( 9 am )     ( 3 pm )        ( 9 pm )                Date L  R  L  R  L  R  Total L/R  Post Anesthesia Home Care Instructions  Activity: Get plenty of rest for the remainder of the day. A responsible individual must stay with you for 24 hours following the procedure.  For the next 24 hours, DO NOT: -Drive a car -Paediatric nurse -Drink alcoholic beverages -Take any medication unless instructed by your physician -Make any legal decisions or sign important papers.  Meals: Start with liquid foods such as gelatin or soup. Progress to regular foods as tolerated. Avoid greasy, spicy, heavy foods. If nausea and/or vomiting occur, drink only clear liquids until the nausea and/or vomiting subsides. Call your physician if vomiting continues.  Special Instructions/Symptoms: Your throat may feel dry or sore from the anesthesia or the breathing tube placed in your throat during surgery. If this causes discomfort, gargle with warm salt water. The discomfort should disappear within 24 hours.  If you had a scopolamine patch placed behind your ear for the management of post- operative nausea and/or vomiting:  1. The medication in the patch is effective for 72 hours, after which it should be removed.  Wrap patch in a tissue and discard in the trash. Wash hands thoroughly with soap and water. 2. You may remove the patch earlier than 72 hours if you experience unpleasant side effects which may include dry mouth, dizziness or visual disturbances. 3. Avoid touching the patch. Wash your hands with soap and water after contact with the patch.

## 2020-09-25 NOTE — Anesthesia Postprocedure Evaluation (Signed)
Anesthesia Post Note  Patient: Cheryl [name]  Procedure(s) Performed: RIGHT BREAST LUMPECTOMY WITH RADIOACTIVE SEED AND RIGHT SENTINEL LYMPH NODE BIOPSY (Right Breast)     Patient location during evaluation: PACU Anesthesia Type: General Level of consciousness: awake and alert and oriented Pain management: pain level controlled Vital Signs Assessment: post-procedure vital signs reviewed and stable Respiratory status: spontaneous breathing, nonlabored ventilation and respiratory function stable Cardiovascular status: blood pressure returned to baseline Postop Assessment: no apparent nausea or vomiting Anesthetic complications: no   No complications documented.  Last Vitals:  Vitals:   09/25/20 0908 09/25/20 0911  BP:  (!) 144/79  Pulse: 72 68  Resp: 13 16  Temp:    SpO2: 100% 98%    Last Pain:  Vitals:   09/25/20 0900  TempSrc:   PainSc: 0-No pain                 Brennan Bailey

## 2020-09-25 NOTE — Anesthesia Procedure Notes (Signed)
Anesthesia Regional Block: Pectoralis block   Pre-Anesthetic Checklist: ,, timeout performed, Correct Patient, Correct Site, Correct Laterality, Correct Procedure, Correct Position, site marked, Risks and benefits discussed, pre-op evaluation,  At surgeon's request and post-op pain management  Laterality: Right  Prep: Maximum Sterile Barrier Precautions used, chloraprep       Needles:  Injection technique: Single-shot  Needle Type: Echogenic Stimulator Needle     Needle Length: 9cm  Needle Gauge: 22     Additional Needles:   Procedures:,,,, ultrasound used (permanent image in chart),,,,  Narrative:  Start time: 09/25/2020 7:04 AM End time: 09/25/2020 7:07 AM Injection made incrementally with aspirations every 5 mL.  Performed by: Personally  Anesthesiologist: Brennan Bailey, MD  Additional Notes: Risks, benefits, and alternative discussed. Patient gave consent for procedure. Patient prepped and draped in sterile fashion. Sedation administered, patient remains easily responsive to voice. Relevant anatomy identified with ultrasound guidance. Local anesthetic given in 5cc increments with no signs or symptoms of intravascular injection. No pain or paraesthesias with injection. Patient monitored throughout procedure with signs of LAST or immediate complications. Tolerated well. Ultrasound image placed in chart.  Tawny Asal, MD

## 2020-09-26 ENCOUNTER — Encounter (HOSPITAL_BASED_OUTPATIENT_CLINIC_OR_DEPARTMENT_OTHER): Payer: Self-pay | Admitting: Surgery

## 2020-09-28 LAB — SURGICAL PATHOLOGY

## 2020-10-01 ENCOUNTER — Other Ambulatory Visit: Payer: Self-pay

## 2020-10-01 ENCOUNTER — Ambulatory Visit: Payer: Medicare Other | Admitting: Family Medicine

## 2020-10-01 DIAGNOSIS — E785 Hyperlipidemia, unspecified: Secondary | ICD-10-CM

## 2020-10-01 NOTE — Telephone Encounter (Signed)
Spoke with patient and she still has another month before refill if due. Patient will callback about 2 weeks before it time for medication to be filled.

## 2020-10-01 NOTE — Telephone Encounter (Signed)
Copied from [place] [id number]. Topic: Quick Communication - Rx Refill/Question >> Oct 01, 2020 11:00 AM Ivar Drape wrote: Medication: rosuvastatin (CRESTOR) 10 MG tablet  Preferred Pharmacy (with phone number or street name):  [id number]   [address] [place] [address]  Phone:  [phone]  Fax:  [phone]

## 2020-10-04 ENCOUNTER — Ambulatory Visit (INDEPENDENT_AMBULATORY_CARE_PROVIDER_SITE_OTHER): Payer: Medicare Other

## 2020-10-04 DIAGNOSIS — Z0000 Encounter for general adult medical examination without abnormal findings: Secondary | ICD-10-CM | POA: Diagnosis not present

## 2020-10-04 DIAGNOSIS — Z0100 Encounter for examination of eyes and vision without abnormal findings: Secondary | ICD-10-CM

## 2020-10-04 DIAGNOSIS — Z1283 Encounter for screening for malignant neoplasm of skin: Secondary | ICD-10-CM

## 2020-10-04 NOTE — Progress Notes (Signed)
Subjective:   Cheryl [name] is a 70 y.o. female who presents for Medicare Annual (Subsequent) preventive examination.  Virtual Visit via Telephone Note  I connected with  Cheryl [name] on 10/04/20 at  9:20 AM EDT by telephone and verified that I am speaking with the correct person using two identifiers.  Location: Patient: home Provider: [place] Persons participating in the virtual visit: [place]   I discussed the limitations, risks, security and privacy concerns of performing an evaluation and management service by telephone and the availability of in person appointments. The patient expressed understanding and agreed to proceed.  Interactive audio and video telecommunications were attempted between this nurse and patient, however failed, due to patient having technical difficulties OR patient did not have access to video capability.  We continued and completed visit with audio only.  Some vital signs may be absent or patient reported.   Clemetine Marker, LPN    Review of Systems     Cardiac Risk Factors include: advanced age (>33men, >28 women);dyslipidemia     Objective:    Today's Vitals   10/04/20 0928  PainSc: 0-No pain   There is no height or weight on file to calculate BMI.  Advanced Directives 10/04/2020 09/25/2020 09/17/2020 08/29/2020 09/30/2019  Does Patient Have a Medical Advance Directive? No No No No No  Would patient like information on creating a medical advance directive? No - Patient declined No - Patient declined No - Patient declined No - Patient declined Yes (MAU/Ambulatory/Procedural Areas - Information given)    Current Medications (verified) Outpatient Encounter Medications as of 10/04/2020  Medication Sig  . ibuprofen (ADVIL) 800 MG tablet Take 1 tablet (800 mg total) by mouth every 8 (eight) hours as needed.  . Multiple Vitamin (MULTIVITAMIN WITH MINERALS) TABS tablet Take 1 tablet by mouth daily.  Marland Kitchen oxyCODONE (OXY IR/ROXICODONE)  5 MG immediate release tablet Take 1 tablet (5 mg total) by mouth every 6 (six) hours as needed for severe pain.  . polyethylene glycol powder (GLYCOLAX/MIRALAX) 17 GM/SCOOP powder Take 1/2 capful to 1 capful (8.5- 17g) g PO by dissolving in clear liquid drink once daily for constipation  . rosuvastatin (CRESTOR) 10 MG tablet TAKE 1 TABLET BY MOUTH EVERYDAY AT BEDTIME  . [DISCONTINUED] escitalopram (LEXAPRO) 5 MG tablet Take 5 mg by mouth at bedtime.  . [DISCONTINUED] alendronate (FOSAMAX) 70 MG tablet Take 1 tablet (70 mg total) by mouth once a week.   No facility-administered encounter medications on file as of 10/04/2020.    Allergies (verified) Patient has no known allergies.   History: Past Medical History:  Diagnosis Date  . Anxiety   . Breast cancer ([place]) 07/2020   right breast ILC  . Hyperlipidemia   . Osteoporosis    Past Surgical History:  Procedure Laterality Date  . BREAST BIOPSY    . BREAST LUMPECTOMY WITH RADIOACTIVE SEED AND SENTINEL LYMPH NODE BIOPSY Right 09/25/2020   Procedure: RIGHT BREAST LUMPECTOMY WITH RADIOACTIVE SEED AND RIGHT SENTINEL LYMPH NODE BIOPSY;  Surgeon: Erroll Luna, MD;  Location: [place];  Service: General;  Laterality: Right;   Family History  Problem Relation Age of Onset  . Breast cancer Mother 60  . Breast cancer Maternal Aunt 70  . Breast cancer Maternal Aunt 70  . Throat cancer Father   . Stroke Brother    Social History   Socioeconomic History  . Marital status: Single    Spouse name: Not on file  . Number  of children: 1  . Years of education: 58  . Highest education level: Not on file  Occupational History  . Occupation: retired  Tobacco Use  . Smoking status: Never Smoker  . Smokeless tobacco: Never Used  Vaping Use  . Vaping Use: Never used  Substance and Sexual Activity  . Alcohol use: Never  . Drug use: Never  . Sexual activity: Yes    Birth control/protection: Post-menopausal, None    Comment:  One female partner off and on  Other Topics Concern  . Not on file  Social History Narrative   Pt lives with daughter and granddaughter in [place]. Worked as CMA at [employer]. Never smoked; no alcohol.    Social Determinants of Health   Financial Resource Strain: Low Risk   . Difficulty of Paying Living Expenses: Not very hard  Food Insecurity: No Food Insecurity  . Worried About Charity fundraiser in the Last Year: Never true  . Ran Out of Food in the Last Year: Never true  Transportation Needs: No Transportation Needs  . Lack of Transportation (Medical): No  . Lack of Transportation (Non-Medical): No  Physical Activity: Sufficiently Active  . Days of Exercise per Week: 3 days  . Minutes of Exercise per Session: 60 min  Stress: No Stress Concern Present  . Feeling of Stress : Only a little  Social Connections: Moderately Isolated  . Frequency of Communication with Friends and Family: More than three times a week  . Frequency of Social Gatherings with Friends and Family: More than three times a week  . Attends Religious Services: More than 4 times per year  . Active Member of Clubs or Organizations: No  . Attends Archivist Meetings: Never  . Marital Status: Widowed    Tobacco Counseling Counseling given: Not Answered   Clinical Intake:  Pre-visit preparation completed: Yes  Pain : No/denies pain Pain Score: 0-No pain     Nutritional Risks: None Diabetes: No  How often do you need to have someone help you when you read instructions, pamphlets, or other written materials from your doctor or pharmacy?: 1 - Never    Interpreter Needed?: No  Information entered by :: Clemetine Marker LPN   Activities of Daily Living In your present state of health, do you have any difficulty performing the following activities: 10/04/2020 09/25/2020  Hearing? N N  Comment declines hearing aids -  Vision? N N  Difficulty concentrating or making decisions? N N  Walking or  climbing stairs? N N  Dressing or bathing? N N  Doing errands, shopping? N -  Preparing Food and eating ? N -  Using the Toilet? N -  In the past six months, have you accidently leaked urine? N -  Do you have problems with loss of bowel control? N -  Managing your Medications? N -  Managing your Finances? N -  Housekeeping or managing your Housekeeping? N -  Some recent data might be hidden    Patient Care Team: Delsa Grana, PA-C as PCP - General (Family Medicine) Rico Junker, RN as Registered Nurse Theodore Demark, RN as Oncology Nurse Navigator Cammie Sickle, MD as Consulting Physician (Oncology) Hollice Espy, MD as Consulting Physician (Urology)  Indicate any recent Medical Services you may have received from other than [facility] providers in the past year (date may be approximate).     Assessment:   This is a routine wellness examination for Cheryl [name].  Hearing/Vision screen  Hearing Screening  125Hz  250Hz  500Hz  1000Hz  2000Hz  3000Hz  4000Hz  6000Hz  8000Hz   Right ear:           Left ear:           Comments: Pt denies hearing difficulty  Vision Screening Comments: Past due for eye exam; referral sent today  Dietary issues and exercise activities discussed: Current Exercise Habits: Home exercise routine, Type of exercise: walking, Time (Minutes): 60, Frequency (Times/Week): 3, Weekly Exercise (Minutes/Week): 180, Intensity: Mild, Exercise limited by: None identified  Goals Addressed            This Visit's Progress   . Patient Stated       Pt states she would like to travel and sight see more over the next year.       Depression Screen PHQ 2/9 Scores 10/04/2020 08/15/2020 04/20/2020 09/30/2019 09/20/2019 06/17/2019  PHQ - 2 Score 2 0 0 0 0 0  PHQ- 9 Score 4 - - - 0 0    Fall Risk Fall Risk  10/04/2020 08/15/2020 04/20/2020 09/30/2019 09/20/2019  Falls in the past year? 0 0 0 0 0  Number falls in past yr: 0 0 0 0 0  Injury with Fall? 0 0 0 0 0  Risk for fall  due to : No Fall Risks - - No Fall Risks -  Follow up Falls prevention discussed - - Falls prevention discussed -    FALL RISK PREVENTION PERTAINING TO THE HOME:  Any stairs in or around the home? No  If so, are there any without handrails? No  Home free of loose throw rugs in walkways, pet beds, electrical cords, etc? Yes  Adequate lighting in your home to reduce risk of falls? Yes   ASSISTIVE DEVICES UTILIZED TO PREVENT FALLS:  Life alert? No  Use of a cane, walker or w/c? No  Grab bars in the bathroom? No  Shower chair or bench in shower? No  Elevated toilet seat or a handicapped toilet? No   TIMED UP AND GO:  Was the test performed? No . Telephonic visit.   Cognitive Function: Normal cognitive status assessed by direct observation by this Nurse Health Advisor. No abnormalities found.          Immunizations Immunization History  Administered Date(s) Administered  . Tdap 04/21/2019    TDAP status: Up to date  Flu Vaccine status: Declined, Education has been provided regarding the importance of this vaccine but patient still declined. Advised may receive this vaccine at local pharmacy or Health Dept. Aware to provide a copy of the vaccination record if obtained from local pharmacy or Health Dept. Verbalized acceptance and understanding.  Pneumococcal vaccine status: Declined,  Education has been provided regarding the importance of this vaccine but patient still declined. Advised may receive this vaccine at local pharmacy or Health Dept. Aware to provide a copy of the vaccination record if obtained from local pharmacy or Health Dept. Verbalized acceptance and understanding.   Covid-19 vaccine status: Declined, Education has been provided regarding the importance of this vaccine but patient still declined. Advised may receive this vaccine at local pharmacy or Health Dept.or vaccine clinic. Aware to provide a copy of the vaccination record if obtained from local pharmacy or  Health Dept. Verbalized acceptance and understanding.  Qualifies for Shingles Vaccine? Yes   Zostavax completed No   Shingrix Completed?: No.    Education has been provided regarding the importance of this vaccine. Patient has been advised to call insurance company to determine out of  pocket expense if they have not yet received this vaccine. Advised may also receive vaccine at local pharmacy or Health Dept. Verbalized acceptance and understanding.  Screening Tests Health Maintenance  Topic Date Due  . Hepatitis C Screening  Never done  . PNA vac Low Risk Adult (1 of 2 - PCV13) 08/15/2021 (Originally 09/22/2017)  . INFLUENZA VACCINE  12/31/2020  . DEXA SCAN  04/18/2021  . MAMMOGRAM  05/11/2022  . Fecal DNA (Cologuard)  06/16/2023  . TETANUS/TDAP  04/20/2029  . HPV VACCINES  Aged Out  . COVID-19 Vaccine  Discontinued    Health Maintenance  Health Maintenance Due  Topic Date Due  . Hepatitis C Screening  Never done    Colorectal cancer screening: Type of screening: Cologuard. Completed 06/15/20. Repeat every 3 years  Mammogram status: Completed 05/11/20. Repeat every year  Bone Density status: Completed 04/19/19. Results reflect: Bone density results: OSTEOPOROSIS. Repeat every 2 years.  Lung Cancer Screening: (Low Dose CT Chest recommended if Age 25-80 years, 30 pack-year currently smoking OR have quit w/in 15years.) does not qualify.   Additional Screening:  Hepatitis C Screening: does qualify; postponed  Vision Screening: Recommended annual ophthalmology exams for early detection of glaucoma and other disorders of the eye. Is the patient up to date with their annual eye exam?  No  Who is the provider or what is the name of the office in which the patient attends annual eye exams? Not established If pt is not established with a provider, would they like to be referred to a provider to establish care? Yes .   Dental Screening: Recommended annual dental exams for proper oral  hygiene  Community Resource Referral / Chronic Care Management: CRR required this visit?  No   CCM required this visit?  No      Plan:     I have personally reviewed and noted the following in the patient's chart:   . Medical and social history . Use of alcohol, tobacco or illicit drugs  . Current medications and supplements including opioid prescriptions.  . Functional ability and status . Nutritional status . Physical activity . Advanced directives . List of other physicians . Hospitalizations, surgeries, and ER visits in previous 12 months . Vitals . Screenings to include cognitive, depression, and falls . Referrals and appointments  In addition, I have reviewed and discussed with patient certain preventive protocols, quality metrics, and best practice recommendations. A written personalized care plan for preventive services as well as general preventive health recommendations were provided to patient.     Clemetine Marker, LPN   [id number]   Nurse Notes: pt requested referral to [facility] on [facility] for routine skin exam. Pt doing well s/p right breast lumpectomy but feeling a little anxious due to diagnosis; counseling & support resources offered and pt to contact office if needed.   Pt may also need to be referred back to endo for osteoporosis; she did not take fosamax as prescribed; advised to discuss with PCP.

## 2020-10-04 NOTE — Patient Instructions (Signed)
[name] , Thank you for taking time to come for your Medicare Wellness Visit. I appreciate your ongoing commitment to your health goals. Please review the following plan we discussed and let me know if I can assist you in the future.   Screening recommendations/referrals: Colonoscopy: Cologuard done 06/15/20. Repeat in 2025 Mammogram: done 05/11/20. Repeat as directed.  Bone Density: done 04/19/19. Repeat 04/2021 Recommended yearly ophthalmology/optometry visit for glaucoma screening and checkup Recommended yearly dental visit for hygiene and checkup  Vaccinations: Influenza vaccine: declined Pneumococcal vaccine: declined Tdap vaccine: done 04/21/19 Shingles vaccine: Shingrix discussed. Please contact your pharmacy for coverage information.  Covid-19: declined  Advanced directives: Advance directive discussed with you today. Even though you declined this today please call our office should you change your mind and we can give you the proper paperwork for you to fill out.  Conditions/risks identified: Keep up the great work!  Next appointment: Follow up in one year for your annual wellness visit    Preventive Care 65 Years and Older, Female Preventive care refers to lifestyle choices and visits with your health care provider that can promote health and wellness. What does preventive care include?  A yearly physical exam. This is also called an annual well check.  Dental exams once or twice a year.  Routine eye exams. Ask your health care provider how often you should have your eyes checked.  Personal lifestyle choices, including:  Daily care of your teeth and gums.  Regular physical activity.  Eating a healthy diet.  Avoiding tobacco and drug use.  Limiting alcohol use.  Practicing safe sex.  Taking low-dose aspirin every day.  Taking vitamin and mineral supplements as recommended by your health care provider. What happens during an annual well check? The  services and screenings done by your health care provider during your annual well check will depend on your age, overall health, lifestyle risk factors, and family history of disease. Counseling  Your health care provider may ask you questions about your:  Alcohol use.  Tobacco use.  Drug use.  Emotional well-being.  Home and relationship well-being.  Sexual activity.  Eating habits.  History of falls.  Memory and ability to understand (cognition).  Work and work Statistician.  Reproductive health. Screening  You may have the following tests or measurements:  Height, weight, and BMI.  Blood pressure.  Lipid and cholesterol levels. These may be checked every 5 years, or more frequently if you are over 56 years old.  Skin check.  Lung cancer screening. You may have this screening every year starting at age 72 if you have a 30-pack-year history of smoking and currently smoke or have quit within the past 15 years.  Fecal occult blood test (FOBT) of the stool. You may have this test every year starting at age 2.  Flexible sigmoidoscopy or colonoscopy. You may have a sigmoidoscopy every 5 years or a colonoscopy every 10 years starting at age 108.  Hepatitis C blood test.  Hepatitis B blood test.  Sexually transmitted disease (STD) testing.  Diabetes screening. This is done by checking your blood sugar (glucose) after you have not eaten for a while (fasting). You may have this done every 1-3 years.  Bone density scan. This is done to screen for osteoporosis. You may have this done starting at age 2.  Mammogram. This may be done every 1-2 years. Talk to your health care provider about how often you should have regular mammograms. Talk with your health care  provider about your test results, treatment options, and if necessary, the need for more tests. Vaccines  Your health care provider may recommend certain vaccines, such as:  Influenza vaccine. This is recommended  every year.  Tetanus, diphtheria, and acellular pertussis (Tdap, Td) vaccine. You may need a Td booster every 10 years.  Zoster vaccine. You may need this after age 49.  Pneumococcal 13-valent conjugate (PCV13) vaccine. One dose is recommended after age 61.  Pneumococcal polysaccharide (PPSV23) vaccine. One dose is recommended after age 68. Talk to your health care provider about which screenings and vaccines you need and how often you need them. This information is not intended to replace advice given to you by your health care provider. Make sure you discuss any questions you have with your health care provider. Document Released: 06/15/2015 Document Revised: 02/06/2016 Document Reviewed: 03/20/2015 Elsevier Interactive Patient Education  2017 [employer] Prevention in the Home Falls can cause injuries. They can happen to people of all ages. There are many things you can do to make your home safe and to help prevent falls. What can I do on the outside of my home?  Regularly fix the edges of walkways and driveways and fix any cracks.  Remove anything that might make you trip as you walk through a door, such as a raised step or threshold.  Trim any bushes or trees on the path to your home.  Use bright outdoor lighting.  Clear any walking paths of anything that might make someone trip, such as rocks or tools.  Regularly check to see if handrails are loose or broken. Make sure that both sides of any steps have handrails.  Any raised decks and porches should have guardrails on the edges.  Have any leaves, snow, or ice cleared regularly.  Use sand or salt on walking paths during winter.  Clean up any spills in your garage right away. This includes oil or grease spills. What can I do in the bathroom?  Use night lights.  Install grab bars by the toilet and in the tub and shower. Do not use towel bars as grab bars.  Use non-skid mats or decals in the tub or shower.  If  you need to sit down in the shower, use a plastic, non-slip stool.  Keep the floor dry. Clean up any water that spills on the floor as soon as it happens.  Remove soap buildup in the tub or shower regularly.  Attach bath mats securely with double-sided non-slip rug tape.  Do not have throw rugs and other things on the floor that can make you trip. What can I do in the bedroom?  Use night lights.  Make sure that you have a light by your bed that is easy to reach.  Do not use any sheets or blankets that are too big for your bed. They should not hang down onto the floor.  Have a firm chair that has side arms. You can use this for support while you get dressed.  Do not have throw rugs and other things on the floor that can make you trip. What can I do in the kitchen?  Clean up any spills right away.  Avoid walking on wet floors.  Keep items that you use a lot in easy-to-reach places.  If you need to reach something above you, use a strong step stool that has a grab bar.  Keep electrical cords out of the way.  Do not use floor polish  or wax that makes floors slippery. If you must use wax, use non-skid floor wax.  Do not have throw rugs and other things on the floor that can make you trip. What can I do with my stairs?  Do not leave any items on the stairs.  Make sure that there are handrails on both sides of the stairs and use them. Fix handrails that are broken or loose. Make sure that handrails are as long as the stairways.  Check any carpeting to make sure that it is firmly attached to the stairs. Fix any carpet that is loose or worn.  Avoid having throw rugs at the top or bottom of the stairs. If you do have throw rugs, attach them to the floor with carpet tape.  Make sure that you have a light switch at the top of the stairs and the bottom of the stairs. If you do not have them, ask someone to add them for you. What else can I do to help prevent falls?  Wear shoes  that:  Do not have high heels.  Have rubber bottoms.  Are comfortable and fit you well.  Are closed at the toe. Do not wear sandals.  If you use a stepladder:  Make sure that it is fully opened. Do not climb a closed stepladder.  Make sure that both sides of the stepladder are locked into place.  Ask someone to hold it for you, if possible.  Clearly mark and make sure that you can see:  Any grab bars or handrails.  First and last steps.  Where the edge of each step is.  Use tools that help you move around (mobility aids) if they are needed. These include:  Canes.  Walkers.  Scooters.  Crutches.  Turn on the lights when you go into a dark area. Replace any light bulbs as soon as they burn out.  Set up your furniture so you have a clear path. Avoid moving your furniture around.  If any of your floors are uneven, fix them.  If there are any pets around you, be aware of where they are.  Review your medicines with your doctor. Some medicines can make you feel dizzy. This can increase your chance of falling. Ask your doctor what other things that you can do to help prevent falls. This information is not intended to replace advice given to you by your health care provider. Make sure you discuss any questions you have with your health care provider. Document Released: 03/15/2009 Document Revised: 10/25/2015 Document Reviewed: 06/23/2014 Elsevier Interactive Patient Education  2017 [employer].

## 2020-10-09 ENCOUNTER — Encounter: Payer: Self-pay | Admitting: Internal Medicine

## 2020-10-09 ENCOUNTER — Telehealth: Payer: Self-pay | Admitting: *Deleted

## 2020-10-09 ENCOUNTER — Other Ambulatory Visit: Payer: Self-pay

## 2020-10-09 ENCOUNTER — Inpatient Hospital Stay: Payer: Medicare Other | Attending: Internal Medicine | Admitting: Internal Medicine

## 2020-10-09 DIAGNOSIS — Z170 Estrogen receptor positive status [ER+]: Secondary | ICD-10-CM | POA: Insufficient documentation

## 2020-10-09 DIAGNOSIS — C50411 Malignant neoplasm of upper-outer quadrant of right female breast: Secondary | ICD-10-CM

## 2020-10-09 DIAGNOSIS — Z800 Family history of malignant neoplasm of digestive organs: Secondary | ICD-10-CM | POA: Diagnosis not present

## 2020-10-09 DIAGNOSIS — Z79899 Other long term (current) drug therapy: Secondary | ICD-10-CM | POA: Diagnosis not present

## 2020-10-09 DIAGNOSIS — Z803 Family history of malignant neoplasm of breast: Secondary | ICD-10-CM | POA: Insufficient documentation

## 2020-10-09 DIAGNOSIS — E785 Hyperlipidemia, unspecified: Secondary | ICD-10-CM | POA: Insufficient documentation

## 2020-10-09 DIAGNOSIS — M810 Age-related osteoporosis without current pathological fracture: Secondary | ICD-10-CM | POA: Insufficient documentation

## 2020-10-09 DIAGNOSIS — Z923 Personal history of irradiation: Secondary | ICD-10-CM | POA: Insufficient documentation

## 2020-10-09 NOTE — Telephone Encounter (Signed)
ONCOTYPE Dx order submitted per md order

## 2020-10-09 NOTE — Progress Notes (Signed)
Recent lumpectomy on 4/26 having some irritation at procedure site.

## 2020-10-09 NOTE — Progress Notes (Signed)
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 ER-95%; /PR >5%; her 2 negative; G-2.   Procedure: Right breast lumpectomy, additional margin biopsies and three  sentinel lymph nodes.  Specimen Laterality: Right breast.  Histologic Type: Lobular.  Histologic Grade:       Glandular ([place]cinar)/Tubular Differentiation: 3       Nuclear Pleomorphism: 1       Mitotic Rate: 1       Overall Grade: 1  Tumor Size: 2.9 x 2 x 2 cm  Treatment Effect in the Breast: No known presurgical therapy  Margins: Final surgical margins negative for invasive lobular carcinoma.       Distance from Closest Margin (mm): 3 mm       Specify Closest Margin (required only if <68m): Superior  Regional Lymph Nodes:       Number of Lymph Nodes Examined: 5       Number of Sentinel Nodes Examined: 3       Number of Lymph Nodes with Macrometastases (>2 mm): 0       Number of Lymph Nodes with Micrometastases: 0       Number of Lymph Nodes with Isolated Tumor Cells (=0.2 mm or =200  cells): 0       Size of Largest Metastatic Deposit (mm): N/A       Extranodal Extension: N/A  Breast Biomarker Testing Performed on Previous Biopsy:       Testing Performed on Case Number: [id number]             Estrogen Receptor: 95%, positive, strong-moderate staining.             Progesterone Receptor: 5%, positive, moderate staining.             HER2: Negative with IHC.             Ki-67: 2%.    Malignant neoplasm of upper-outer quadrant of right breast in female, estrogen receptor  positive ([facility]  08/15/2020 Initial Diagnosis   Malignant neoplasm of upper-outer quadrant of right breast in female, estrogen receptor positive ([facility]      HISTORY OF PRESENTING ILLNESS:  CDenoraA [name] 70y.o.  female with newly diagnosed breast cancer status postlumpectomy is here for follow-up.  Patient has a drain in place complains of discomfort at the site of the drain.  Denies any erythema or worsening pain.  She is awaiting evaluation with surgery on 5/13.   Review of Systems  Constitutional: Negative for chills, diaphoresis, fever, malaise/fatigue and weight loss.  HENT: Negative for nosebleeds and sore throat.   Eyes: Negative for double vision.  Respiratory: Negative for cough, hemoptysis, sputum production, shortness of breath and wheezing.   Cardiovascular: Negative for chest pain, palpitations, orthopnea and leg swelling.  Gastrointestinal: Negative for abdominal pain, blood in stool, constipation, diarrhea, heartburn, melena, nausea and vomiting.  Genitourinary: Negative for dysuria, frequency and urgency.  Musculoskeletal: Negative for back pain and joint pain.  Skin: Negative.  Negative for itching and rash.  Neurological: Negative for dizziness, tingling, focal weakness, weakness and headaches.  Endo/Heme/Allergies: Does not bruise/bleed easily.  Psychiatric/Behavioral: Negative for depression. The patient is not nervous/anxious and does not have insomnia.      MEDICAL HISTORY:  Past Medical History:  Diagnosis Date  . Anxiety   . Breast cancer ([place]) 07/2020   right breast ILC  . Hyperlipidemia   . Osteoporosis     SURGICAL HISTORY: Past Surgical History:  Procedure Laterality Date  . BREAST BIOPSY    . BREAST LUMPECTOMY WITH RADIOACTIVE SEED AND SENTINEL LYMPH NODE BIOPSY Right 09/25/2020   Procedure: RIGHT BREAST LUMPECTOMY WITH RADIOACTIVE SEED AND RIGHT SENTINEL LYMPH NODE BIOPSY;  Surgeon: Erroll Luna, MD;  Location: [facility];   Service: General;  Laterality: Right;    SOCIAL HISTORY: Social History   Socioeconomic History  . Marital status: Single    Spouse name: Not on file  . Number of children: 1  . Years of education: 35  . Highest education level: Not on file  Occupational History  . Occupation: retired  Tobacco Use  . Smoking status: Never Smoker  . Smokeless tobacco: Never Used  Vaping Use  . Vaping Use: Never used  Substance and Sexual Activity  . Alcohol use: Never  . Drug use: Never  . Sexual activity: Yes    Birth control/protection: Post-menopausal, None    Comment: One female partner off and on  Other Topics Concern  . Not on file  Social History Narrative   Pt lives with daughter and granddaughter in [place]. Worked as CMA at [employer]. Never smoked; no alcohol.    Social Determinants of Health   Financial Resource Strain: Low Risk   . Difficulty of Paying Living Expenses: Not very hard  Food Insecurity: No Food Insecurity  . Worried About Charity fundraiser in the Last Year: Never true  . Ran Out of Food in the Last Year: Never true  Transportation Needs: No Transportation Needs  . Lack of Transportation (Medical): No  . Lack of Transportation (Non-Medical): No  Physical Activity: Sufficiently Active  . Days of Exercise per Week: 3 days  . Minutes of Exercise per Session: 60 min  Stress: No Stress Concern Present  . Feeling of Stress : Only a little  Social Connections: Moderately Isolated  . Frequency of Communication with Friends and Family: More than three times a week  . Frequency of Social Gatherings with Friends and Family: More than three times a week  . Attends Religious Services: More than 4 times per year  . Active Member of Clubs or Organizations: No  . Attends Archivist Meetings: Never  . Marital Status: Widowed  Intimate Partner Violence: Not At Risk  . Fear of Current or Ex-Partner: No  . Emotionally Abused: No  . Physically Abused: No  .  Sexually Abused: No    FAMILY HISTORY: Family History  Problem Relation Age of Onset  . Breast cancer Mother 37  . Breast cancer Maternal Aunt 70  . Breast cancer Maternal Aunt 70  . Throat cancer Father   . Stroke Brother     ALLERGIES:  has No Known Allergies.  MEDICATIONS:  Current Outpatient Medications  Medication Sig Dispense Refill  . ibuprofen (ADVIL) 800 MG tablet Take 1 tablet (800 mg total) by mouth every 8 (eight) hours as needed. 30 tablet 0  . Multiple Vitamin (MULTIVITAMIN WITH MINERALS) TABS tablet Take 1 tablet by mouth daily.    Marland Kitchen oxyCODONE (OXY IR/ROXICODONE) 5 MG immediate release tablet Take 1 tablet (5  mg total) by mouth every 6 (six) hours as needed for severe pain. 15 tablet 0  . polyethylene glycol powder (GLYCOLAX/MIRALAX) 17 GM/SCOOP powder Take 1/2 capful to 1 capful (8.5- 17g) g PO by dissolving in clear liquid drink once daily for constipation 578 g 1  . rosuvastatin (CRESTOR) 10 MG tablet TAKE 1 TABLET BY MOUTH EVERYDAY AT BEDTIME 90 tablet 0   No current facility-administered medications for this visit.    PHYSICAL EXAMINATION: ECOG PERFORMANCE STATUS: 0 - Asymptomatic  Vitals:   10/09/20 1313  BP: (!) 144/77  Pulse: 76  Resp: 16  Temp: 99.2 F (37.3 C)  SpO2: 100%   Filed Weights   10/09/20 1313  Weight: 141 lb 12.8 oz (64.3 kg)    Physical Exam HENT:     Head: Normocephalic and atraumatic.     Mouth/Throat:     Pharynx: No oropharyngeal exudate.  Eyes:     Pupils: Pupils are equal, round, and reactive to light.  Cardiovascular:     Rate and Rhythm: Normal rate and regular rhythm.  Pulmonary:     Effort: No respiratory distress.     Breath sounds: No wheezing.  Abdominal:     General: Bowel sounds are normal. There is no distension.     Palpations: Abdomen is soft. There is no mass.     Tenderness: There is no abdominal tenderness. There is no guarding or rebound.  Musculoskeletal:        General: No tenderness. Normal  range of motion.     Cervical back: Normal range of motion and neck supple.  Skin:    General: Skin is warm.     Comments: Right breast exam-drain in place.  Incision well healing no signs of infection.;  Chaperoned by nurse.   Neurological:     Mental Status: She is alert and oriented to person, place, and time.  Psychiatric:        Mood and Affect: Affect normal.      LABORATORY DATA:  I have reviewed the data as listed Lab Results  Component Value Date   WBC 4.4 09/21/2020   HGB 11.4 (L) 09/21/2020   HCT 35.5 (L) 09/21/2020   MCV 88.1 09/21/2020   PLT 232 09/21/2020   Recent Labs    04/20/20 1127 09/21/20 1109  NA 139 138  K 5.2 4.6  CL 104 106  CO2 28 26  GLUCOSE 85 85  BUN 21 19  CREATININE 1.00* 0.99  CALCIUM 10.1 9.5  GFRNONAA 58* >60  GFRAA 68  --   PROT 7.8 7.7  ALBUMIN  --  3.8  AST 15 20  ALT 10 13  ALKPHOS  --  65  BILITOT 0.3 0.7    RADIOGRAPHIC STUDIES: I have personally reviewed the radiological images as listed and agreed with the findings in the report. NM Sentinel Node Inj-No Rpt (Breast)  Result Date: 09/25/2020 Sulfur colloid was injected by the nuclear medicine technologist for melanoma sentinel node.   MM Breast Surgical Specimen  Result Date: 09/25/2020 CLINICAL DATA:  One radiograph status post right breast lumpectomy. EXAM: SPECIMEN RADIOGRAPH OF THE RIGHT BREAST COMPARISON:  Previous exam(s). FINDINGS: Status post excision of the right breast. The radioactive seed and biopsy marker clip are present, completely intact, and were marked for pathology. These findings were communicated to the OR at 8:15 a.m. IMPRESSION: Specimen radiograph of the right breast. Electronically Signed   By: Ammie Ferrier M.D.   On: 09/25/2020 08:15  [place] RT RADIOACTIVE SEED LOC  Result Date: 09/24/2020 CLINICAL DATA:  Patient for preoperative localization prior to right lumpectomy. EXAM: ULTRASOUND GUIDED RADIOACTIVE SEED LOCALIZATION OF THE RIGHT BREAST  COMPARISON:  Previous exam(s). FINDINGS: Patient presents for radioactive seed localization prior to right lumpectomy. I met with the patient and we discussed the procedure of seed localization including benefits and alternatives. We discussed the high likelihood of a successful procedure. We discussed the risks of the procedure including infection, bleeding, tissue injury and further surgery. We discussed the low dose of radioactivity involved in the procedure. Informed, written consent was given. The usual time-out protocol was performed immediately prior to the procedure. Using ultrasound guidance, sterile technique, 1% lidocaine and an I-125 radioactive seed, right breast mass was localized using a lateral approach. The follow-up mammogram images confirm the seed in the expected location and were marked for Dr. Brantley Stage. Follow-up survey of the patient confirms presence of the radioactive seed. Order number of I-125 seed:  [id number]. Total activity:  4.696 millicuries reference Date: 09/03/2020 The patient tolerated the procedure well and was released from the [facility]. She was given instructions regarding seed removal. IMPRESSION: Radioactive seed localization right breast. No apparent complications. Electronically Signed   By: Lovey Newcomer M.D.   On: 09/24/2020 15:56   MM CLIP PLACEMENT RIGHT  Result Date: 09/24/2020 CLINICAL DATA:  Status post seed placement right breast. EXAM: DIAGNOSTIC RIGHT MAMMOGRAM POST ULTRASOUND-GUIDED RADIOACTIVE SEED PLACEMENT COMPARISON:  Previous exam(s). FINDINGS: Mammographic images were obtained following ultrasound-guided radioactive seed placement. These demonstrate radioactive seed adjacent to the biopsy marking clip. This is only demonstrated on the true lateral views given posterior location. IMPRESSION: Appropriate location of the radioactive seed. Final Assessment: Post Procedure Mammograms for Seed Placement Electronically Signed   By: Lovey Newcomer M.D.   On:  09/24/2020 15:50    ASSESSMENT & PLAN:   Malignant neoplasm of upper-outer quadrant of right breast in female, estrogen receptor positive ([place]) #Lobular invasive breast cancer-pathologic stage IA [clinical stage II]T2N0 ER -positive; PR positive [5%]; HER2 negative grade 2.  S/p lumpectomy negative margins.  #Recommend Oncotype testing-to assess the benefit from chemotherapy.  However clinically it is very less likely patient would chemotherapy.  Understands the test may take up to 3 weeks.  Patient post radiation will need to go on aromatase inhibitor therapy for total of 5 years.  Discussed mechanism of action including but not limited to musculoskeletal/hot flashes osteoporosis.  #Recommend proceeding with radiation. Discussed the radiation schedule-Monday through Friday [5 times a week]; possible radiation side effects including skin rash/fatigue; We will make a referral to Dr. Donella Stade.   # genetics- mother- 66 sec to breast cancer; 2 aunts [mom sister]; patient would benefit from genetic evaluation. Will refer to next visit  # DISPOSITION: oncotype # refer to Dr.Crystal e: breast cancer in 2 weeks # follow up in 4 weeks-:MD; no labs- Dr.B     All questions were answered. The patient/family knows to call the clinic with any problems, questions or concerns.       Cammie Sickle, MD 10/09/2020 2:00 PM

## 2020-10-09 NOTE — Assessment & Plan Note (Addendum)
#  Lobular invasive breast cancer-pathologic stage IA [clinical stage II]T2N0 ER -positive; PR positive [5%]; HER2 negative grade 2.  S/p lumpectomy negative margins.  #Recommend Oncotype testing-to assess the benefit from chemotherapy.  However clinically it is very less likely patient would chemotherapy.  Understands the test may take up to 3 weeks.  Patient post radiation will need to go on aromatase inhibitor therapy for total of 5 years.  Discussed mechanism of action including but not limited to musculoskeletal/hot flashes osteoporosis.  #Recommend proceeding with radiation. Discussed the radiation schedule-Monday through Friday [5 times a week]; possible radiation side effects including skin rash/fatigue; We will make a referral to Dr. Donella Stade.   # genetics- mother- 33 sec to breast cancer; 2 aunts [mom sister]; patient would benefit from genetic evaluation. Will refer to next visit  # DISPOSITION: oncotype # refer to Dr.Crystal e: breast cancer in 2 weeks # follow up in 4 weeks-:MD; no labs- Dr.B

## 2020-10-21 ENCOUNTER — Other Ambulatory Visit: Payer: Self-pay | Admitting: Family Medicine

## 2020-10-21 DIAGNOSIS — F419 Anxiety disorder, unspecified: Secondary | ICD-10-CM

## 2020-10-22 ENCOUNTER — Encounter: Payer: Self-pay | Admitting: Internal Medicine

## 2020-10-22 ENCOUNTER — Encounter (HOSPITAL_COMMUNITY): Payer: Self-pay

## 2020-10-22 ENCOUNTER — Ambulatory Visit
Admission: RE | Admit: 2020-10-22 | Discharge: 2020-10-22 | Disposition: A | Discharge status: Home / Self Care | Payer: Medicare Other | Source: Ambulatory Visit | Attending: Radiation Oncology | Admitting: Radiation Oncology

## 2020-10-22 ENCOUNTER — Encounter: Payer: Self-pay | Admitting: Radiation Oncology

## 2020-10-22 ENCOUNTER — Other Ambulatory Visit: Payer: Self-pay

## 2020-10-22 VITALS — BP 144/83 | HR 89 | Temp 97.1°F | Wt 141.0 lb

## 2020-10-22 DIAGNOSIS — Z170 Estrogen receptor positive status [ER+]: Secondary | ICD-10-CM

## 2020-10-22 DIAGNOSIS — C50411 Malignant neoplasm of upper-outer quadrant of right female breast: Secondary | ICD-10-CM

## 2020-10-22 NOTE — Consult Note (Signed)
NEW PATIENT EVALUATION  Name: Cheryl [name]  MRN: [id number]  Date:   10/22/2020     DOB: 02/18/1951   This 70 y.o. female patient presents to the clinic for initial evaluation of invasive lobular carcinoma of the right breast stage II (T2 N0 M0 ER positive PR borderline positive HER2/neu negative.  REFERRING PHYSICIAN: Delsa Grana, PA-C  CHIEF COMPLAINT:  Chief Complaint  Patient presents with  . Consult    DIAGNOSIS: The encounter diagnosis was Malignant neoplasm of upper-outer quadrant of right breast in female, estrogen receptor positive (Cheryl [name]).   PREVIOUS INVESTIGATIONS:  Mammogram and ultrasound as well as MRI scans reviewed Clinical notes reviewed Pathology report reviewed  HPI: Patient is a 69 year old female who presented with an abnormal mammogram of her right breast.  There was asymmetry noted in the right breast at the 10 o'clock position measuring 2.3 x 1.3 x 1.6 cm.  There is a hypoechoic area corresponding to this and she underwent targeted ultrasound which was positive for invasive lobular carcinoma ER positive PR borderline and HER2/neu not overexpressed.  She would not have a wide local excision showing an area of 2.9 x 2 x 2 cm of overall grade 1 invasive lobular carcinoma.  5 lymph nodes were examined 3 sentinel nodes all showed no evidence of metastatic disease.  Margins were clear but close at 3 mm.  Oncotype DX has been ordered although will probably be low risk of recurrence based on the invasive lobular nature of her disease.  She is seen today for ration oncology opinion she is doing well.  She specifically denies breast tenderness cough or bone pain.  PLANNED TREATMENT REGIMEN: Right hypofractionated whole breast radiation  PAST MEDICAL HISTORY:  has a past medical history of Anxiety, Breast cancer ([place]) (07/2020), Hyperlipidemia, and Osteoporosis.    PAST SURGICAL HISTORY:  Past Surgical History:  Procedure Laterality Date  . BREAST BIOPSY    . BREAST  LUMPECTOMY WITH RADIOACTIVE SEED AND SENTINEL LYMPH NODE BIOPSY Right 09/25/2020   Procedure: RIGHT BREAST LUMPECTOMY WITH RADIOACTIVE SEED AND RIGHT SENTINEL LYMPH NODE BIOPSY;  Surgeon: Erroll Luna, MD;  Location: [facility];  Service: General;  Laterality: Right;    FAMILY HISTORY: family history includes Breast cancer (age of onset: 68) in her mother; Breast cancer (age of onset: 43) in her maternal aunt and maternal aunt; Stroke in her brother; Throat cancer in her father.  SOCIAL HISTORY:  reports that she has never smoked. She has never used smokeless tobacco. She reports that she does not drink alcohol and does not use drugs.  ALLERGIES: Patient has no known allergies.  MEDICATIONS:  Current Outpatient Medications  Medication Sig Dispense Refill  . ibuprofen (ADVIL) 800 MG tablet Take 1 tablet (800 mg total) by mouth every 8 (eight) hours as needed. 30 tablet 0  . Multiple Vitamin (MULTIVITAMIN WITH MINERALS) TABS tablet Take 1 tablet by mouth daily.    Marland Kitchen oxyCODONE (OXY IR/ROXICODONE) 5 MG immediate release tablet Take 1 tablet (5 mg total) by mouth every 6 (six) hours as needed for severe pain. 15 tablet 0  . polyethylene glycol powder (GLYCOLAX/MIRALAX) 17 GM/SCOOP powder Take 1/2 capful to 1 capful (8.5- 17g) g PO by dissolving in clear liquid drink once daily for constipation 578 g 1  . rosuvastatin (CRESTOR) 10 MG tablet TAKE 1 TABLET BY MOUTH EVERYDAY AT BEDTIME 90 tablet 0   No current facility-administered medications for this encounter.    ECOG PERFORMANCE STATUS:  0 -  Asymptomatic  REVIEW OF SYSTEMS: Patient denies any weight loss, fatigue, weakness, fever, chills or night sweats. Patient denies any loss of vision, blurred vision. Patient denies any ringing  of the ears or hearing loss. No irregular heartbeat. Patient denies heart murmur or history of fainting. Patient denies any chest pain or pain radiating to her upper extremities. Patient denies any  shortness of breath, difficulty breathing at night, cough or hemoptysis. Patient denies any swelling in the lower legs. Patient denies any nausea vomiting, vomiting of blood, or coffee ground material in the vomitus. Patient denies any stomach pain. Patient states has had normal bowel movements no significant constipation or diarrhea. Patient denies any dysuria, hematuria or significant nocturia. Patient denies any problems walking, swelling in the joints or loss of balance. Patient denies any skin changes, loss of hair or loss of weight. Patient denies any excessive worrying or anxiety or significant depression. Patient denies any problems with insomnia. Patient denies excessive thirst, polyuria, polydipsia. Patient denies any swollen glands, patient denies easy bruising or easy bleeding. Patient denies any recent infections, allergies or URI. Patient "s visual fields have not changed significantly in recent time.   PHYSICAL EXAM: BP (!) 144/83   Pulse 89   Temp (!) 97.1 F (36.2 C) (Tympanic)   Wt 141 lb (64 kg)   BMI 22.08 kg/m  She has a wide local excision in the right lateral aspect of her breast which is healing well.  No dominant masses noted in either breast.  No axillary or supraclavicular adenopathy is identified.  Well-developed well-nourished patient in NAD. HEENT reveals PERLA, EOMI, discs not visualized.  Oral cavity is clear. No oral mucosal lesions are identified. Neck is clear without evidence of cervical or supraclavicular adenopathy. Lungs are clear to A&P. Cardiac examination is essentially unremarkable with regular rate and rhythm without murmur rub or thrill. Abdomen is benign with no organomegaly or masses noted. Motor sensory and DTR levels are equal and symmetric in the upper and lower extremities. Cranial nerves II through XII are grossly intact. Proprioception is intact. No peripheral adenopathy or edema is identified. No motor or sensory levels are noted. Crude visual fields  are within normal range.  LABORATORY DATA: Pathology report reviewed    RADIOLOGY RESULTS: Mammogram ultrasound and MRI scans reviewed compatible with above-stated findings   IMPRESSION: Stage II invasive lobular carcinoma of the right breast status post wide local excision and sentinel node biopsy in 70 year old female  PLAN: At this time I will await the Oncotype DX results prior to proceeding with radiation therapy.  I would plan on a hypofractionated course over 3 weeks of whole breast radiation.  I would also boost her scar another 1000 cGy using electron beam based on the clear but close margin.  Risks and benefits of treatment including skin reaction fatigue alteration blood counts possible inclusion of superficial lung all were described in detail to the patient she seems to comprehend my treatment plan well.  I have personally set up and ordered CT simulation after the Memorial Day weekend 1 we will have her Oncotype DX results back.  Patient comprehends all my recommendations well.  I would like to take this opportunity to thank you for allowing me to participate in the care of your patient.Noreene Filbert, MD

## 2020-10-23 ENCOUNTER — Telehealth: Payer: Self-pay | Admitting: Internal Medicine

## 2020-10-23 NOTE — Telephone Encounter (Signed)
On 5/23-left voicemail for the patient that Oncotype showed low risk of recurrence-hence would not recommend chemotherapy.  Recommend proceeding with radiation therapy as planned.  Follow-up as planned.  GB

## 2020-11-01 ENCOUNTER — Ambulatory Visit
Admission: RE | Admit: 2020-11-01 | Discharge: 2020-11-01 | Disposition: A | Discharge status: Home / Self Care | Payer: Medicare Other | Source: Ambulatory Visit | Attending: Radiation Oncology | Admitting: Radiation Oncology

## 2020-11-01 DIAGNOSIS — C50411 Malignant neoplasm of upper-outer quadrant of right female breast: Secondary | ICD-10-CM | POA: Diagnosis present

## 2020-11-01 DIAGNOSIS — Z170 Estrogen receptor positive status [ER+]: Secondary | ICD-10-CM | POA: Diagnosis not present

## 2020-11-01 DIAGNOSIS — Z510 Encounter for antineoplastic radiation therapy: Secondary | ICD-10-CM | POA: Insufficient documentation

## 2020-11-02 ENCOUNTER — Other Ambulatory Visit: Payer: Self-pay | Admitting: *Deleted

## 2020-11-02 DIAGNOSIS — Z170 Estrogen receptor positive status [ER+]: Secondary | ICD-10-CM

## 2020-11-02 DIAGNOSIS — C50411 Malignant neoplasm of upper-outer quadrant of right female breast: Secondary | ICD-10-CM

## 2020-11-05 DIAGNOSIS — C50411 Malignant neoplasm of upper-outer quadrant of right female breast: Secondary | ICD-10-CM | POA: Diagnosis not present

## 2020-11-06 ENCOUNTER — Inpatient Hospital Stay: Payer: Medicare Other | Attending: Internal Medicine | Admitting: Internal Medicine

## 2020-11-08 ENCOUNTER — Ambulatory Visit: Payer: Medicare Other

## 2020-11-09 ENCOUNTER — Ambulatory Visit: Admission: RE | Admit: 2020-11-09 | Payer: Medicare Other | Source: Ambulatory Visit

## 2020-11-09 DIAGNOSIS — C50411 Malignant neoplasm of upper-outer quadrant of right female breast: Secondary | ICD-10-CM | POA: Diagnosis not present

## 2020-11-12 ENCOUNTER — Ambulatory Visit
Admission: RE | Admit: 2020-11-12 | Discharge: 2020-11-12 | Disposition: A | Discharge status: Home / Self Care | Payer: Medicare Other | Source: Ambulatory Visit | Attending: Radiation Oncology | Admitting: Radiation Oncology

## 2020-11-12 DIAGNOSIS — C50411 Malignant neoplasm of upper-outer quadrant of right female breast: Secondary | ICD-10-CM | POA: Diagnosis not present

## 2020-11-13 ENCOUNTER — Ambulatory Visit
Admission: RE | Admit: 2020-11-13 | Discharge: 2020-11-13 | Disposition: A | Discharge status: Home / Self Care | Payer: Medicare Other | Source: Ambulatory Visit | Attending: Radiation Oncology | Admitting: Radiation Oncology

## 2020-11-13 DIAGNOSIS — C50411 Malignant neoplasm of upper-outer quadrant of right female breast: Secondary | ICD-10-CM | POA: Diagnosis not present

## 2020-11-14 ENCOUNTER — Ambulatory Visit
Admission: RE | Admit: 2020-11-14 | Discharge: 2020-11-14 | Disposition: A | Discharge status: Home / Self Care | Payer: Medicare Other | Source: Ambulatory Visit | Attending: Radiation Oncology | Admitting: Radiation Oncology

## 2020-11-14 DIAGNOSIS — C50411 Malignant neoplasm of upper-outer quadrant of right female breast: Secondary | ICD-10-CM | POA: Diagnosis not present

## 2020-11-15 ENCOUNTER — Ambulatory Visit
Admission: RE | Admit: 2020-11-15 | Discharge: 2020-11-15 | Disposition: A | Discharge status: Home / Self Care | Payer: Medicare Other | Source: Ambulatory Visit | Attending: Radiation Oncology | Admitting: Radiation Oncology

## 2020-11-15 DIAGNOSIS — C50411 Malignant neoplasm of upper-outer quadrant of right female breast: Secondary | ICD-10-CM | POA: Diagnosis not present

## 2020-11-16 ENCOUNTER — Ambulatory Visit
Admission: RE | Admit: 2020-11-16 | Discharge: 2020-11-16 | Disposition: A | Discharge status: Home / Self Care | Payer: Medicare Other | Source: Ambulatory Visit | Attending: Radiation Oncology | Admitting: Radiation Oncology

## 2020-11-16 DIAGNOSIS — C50411 Malignant neoplasm of upper-outer quadrant of right female breast: Secondary | ICD-10-CM | POA: Diagnosis not present

## 2020-11-19 ENCOUNTER — Ambulatory Visit
Admission: RE | Admit: 2020-11-19 | Discharge: 2020-11-19 | Disposition: A | Discharge status: Home / Self Care | Payer: Medicare Other | Source: Ambulatory Visit | Attending: Radiation Oncology | Admitting: Radiation Oncology

## 2020-11-19 DIAGNOSIS — C50411 Malignant neoplasm of upper-outer quadrant of right female breast: Secondary | ICD-10-CM | POA: Diagnosis not present

## 2020-11-20 ENCOUNTER — Ambulatory Visit
Admission: RE | Admit: 2020-11-20 | Discharge: 2020-11-20 | Disposition: A | Discharge status: Home / Self Care | Payer: Medicare Other | Source: Ambulatory Visit | Attending: Radiation Oncology | Admitting: Radiation Oncology

## 2020-11-20 DIAGNOSIS — C50411 Malignant neoplasm of upper-outer quadrant of right female breast: Secondary | ICD-10-CM | POA: Diagnosis not present

## 2020-11-21 ENCOUNTER — Ambulatory Visit
Admission: RE | Admit: 2020-11-21 | Discharge: 2020-11-21 | Disposition: A | Discharge status: Home / Self Care | Payer: Medicare Other | Source: Ambulatory Visit | Attending: Radiation Oncology | Admitting: Radiation Oncology

## 2020-11-21 DIAGNOSIS — C50411 Malignant neoplasm of upper-outer quadrant of right female breast: Secondary | ICD-10-CM | POA: Diagnosis not present

## 2020-11-22 ENCOUNTER — Ambulatory Visit
Admission: RE | Admit: 2020-11-22 | Discharge: 2020-11-22 | Disposition: A | Discharge status: Home / Self Care | Payer: Medicare Other | Source: Ambulatory Visit | Attending: Radiation Oncology | Admitting: Radiation Oncology

## 2020-11-22 DIAGNOSIS — C50411 Malignant neoplasm of upper-outer quadrant of right female breast: Secondary | ICD-10-CM | POA: Diagnosis not present

## 2020-11-23 ENCOUNTER — Ambulatory Visit
Admission: RE | Admit: 2020-11-23 | Discharge: 2020-11-23 | Disposition: A | Discharge status: Home / Self Care | Payer: Medicare Other | Source: Ambulatory Visit | Attending: Radiation Oncology | Admitting: Radiation Oncology

## 2020-11-23 ENCOUNTER — Ambulatory Visit: Payer: Medicare Other

## 2020-11-23 DIAGNOSIS — C50411 Malignant neoplasm of upper-outer quadrant of right female breast: Secondary | ICD-10-CM | POA: Diagnosis not present

## 2020-11-24 DIAGNOSIS — C50411 Malignant neoplasm of upper-outer quadrant of right female breast: Secondary | ICD-10-CM | POA: Diagnosis not present

## 2020-11-26 ENCOUNTER — Inpatient Hospital Stay: Payer: Medicare Other | Attending: Internal Medicine

## 2020-11-26 ENCOUNTER — Ambulatory Visit
Admission: RE | Admit: 2020-11-26 | Discharge: 2020-11-26 | Disposition: A | Discharge status: Home / Self Care | Payer: Medicare Other | Source: Ambulatory Visit | Attending: Radiation Oncology | Admitting: Radiation Oncology

## 2020-11-26 ENCOUNTER — Other Ambulatory Visit: Payer: Self-pay

## 2020-11-26 DIAGNOSIS — Z170 Estrogen receptor positive status [ER+]: Secondary | ICD-10-CM | POA: Diagnosis not present

## 2020-11-26 DIAGNOSIS — C50411 Malignant neoplasm of upper-outer quadrant of right female breast: Secondary | ICD-10-CM | POA: Insufficient documentation

## 2020-11-26 LAB — CBC
HCT: 35.9 % — ABNORMAL LOW (ref 36.0–46.0)
Hemoglobin: 11.6 g/dL — ABNORMAL LOW (ref 12.0–15.0)
MCH: 28.4 pg (ref 26.0–34.0)
MCHC: 32.3 g/dL (ref 30.0–36.0)
MCV: 88 fL (ref 80.0–100.0)
Platelets: 220 10*3/uL (ref 150–400)
RBC: 4.08 MIL/uL (ref 3.87–5.11)
RDW: 15.9 % — ABNORMAL HIGH (ref 11.5–15.5)
WBC: 4.2 10*3/uL (ref 4.0–10.5)
nRBC: 0 % (ref 0.0–0.2)

## 2020-11-27 ENCOUNTER — Ambulatory Visit
Admission: RE | Admit: 2020-11-27 | Discharge: 2020-11-27 | Disposition: A | Discharge status: Home / Self Care | Payer: Medicare Other | Source: Ambulatory Visit | Attending: Radiation Oncology | Admitting: Radiation Oncology

## 2020-11-27 DIAGNOSIS — C50411 Malignant neoplasm of upper-outer quadrant of right female breast: Secondary | ICD-10-CM | POA: Diagnosis not present

## 2020-11-28 ENCOUNTER — Ambulatory Visit
Admission: RE | Admit: 2020-11-28 | Discharge: 2020-11-28 | Disposition: A | Discharge status: Home / Self Care | Payer: Medicare Other | Source: Ambulatory Visit | Attending: Radiation Oncology | Admitting: Radiation Oncology

## 2020-11-28 DIAGNOSIS — C50411 Malignant neoplasm of upper-outer quadrant of right female breast: Secondary | ICD-10-CM | POA: Diagnosis not present

## 2020-11-29 ENCOUNTER — Ambulatory Visit
Admission: RE | Admit: 2020-11-29 | Discharge: 2020-11-29 | Disposition: A | Discharge status: Home / Self Care | Payer: Medicare Other | Source: Ambulatory Visit | Attending: Radiation Oncology | Admitting: Radiation Oncology

## 2020-11-29 DIAGNOSIS — C50411 Malignant neoplasm of upper-outer quadrant of right female breast: Secondary | ICD-10-CM | POA: Diagnosis not present

## 2020-11-30 ENCOUNTER — Ambulatory Visit
Admission: RE | Admit: 2020-11-30 | Discharge: 2020-11-30 | Disposition: A | Discharge status: Home / Self Care | Payer: Medicare Other | Source: Ambulatory Visit | Attending: Radiation Oncology | Admitting: Radiation Oncology

## 2020-11-30 DIAGNOSIS — C50411 Malignant neoplasm of upper-outer quadrant of right female breast: Secondary | ICD-10-CM | POA: Insufficient documentation

## 2020-11-30 DIAGNOSIS — Z510 Encounter for antineoplastic radiation therapy: Secondary | ICD-10-CM | POA: Insufficient documentation

## 2020-11-30 DIAGNOSIS — Z170 Estrogen receptor positive status [ER+]: Secondary | ICD-10-CM | POA: Insufficient documentation

## 2020-12-04 ENCOUNTER — Ambulatory Visit
Admission: RE | Admit: 2020-12-04 | Discharge: 2020-12-04 | Disposition: A | Discharge status: Home / Self Care | Payer: Medicare Other | Source: Ambulatory Visit | Attending: Radiation Oncology | Admitting: Radiation Oncology

## 2020-12-04 DIAGNOSIS — Z510 Encounter for antineoplastic radiation therapy: Secondary | ICD-10-CM | POA: Diagnosis not present

## 2020-12-05 ENCOUNTER — Ambulatory Visit
Admission: RE | Admit: 2020-12-05 | Discharge: 2020-12-05 | Disposition: A | Discharge status: Home / Self Care | Payer: Medicare Other | Source: Ambulatory Visit | Attending: Radiation Oncology | Admitting: Radiation Oncology

## 2020-12-05 DIAGNOSIS — Z510 Encounter for antineoplastic radiation therapy: Secondary | ICD-10-CM | POA: Diagnosis not present

## 2020-12-06 ENCOUNTER — Ambulatory Visit
Admission: RE | Admit: 2020-12-06 | Discharge: 2020-12-06 | Disposition: A | Discharge status: Home / Self Care | Payer: Medicare Other | Source: Ambulatory Visit | Attending: Radiation Oncology | Admitting: Radiation Oncology

## 2020-12-06 DIAGNOSIS — Z510 Encounter for antineoplastic radiation therapy: Secondary | ICD-10-CM | POA: Diagnosis not present

## 2020-12-07 ENCOUNTER — Ambulatory Visit
Admission: RE | Admit: 2020-12-07 | Discharge: 2020-12-07 | Disposition: A | Discharge status: Home / Self Care | Payer: Medicare Other | Source: Ambulatory Visit | Attending: Radiation Oncology | Admitting: Radiation Oncology

## 2020-12-07 DIAGNOSIS — Z510 Encounter for antineoplastic radiation therapy: Secondary | ICD-10-CM | POA: Diagnosis not present

## 2020-12-10 ENCOUNTER — Inpatient Hospital Stay: Payer: Medicare Other

## 2020-12-10 ENCOUNTER — Ambulatory Visit
Admission: RE | Admit: 2020-12-10 | Discharge: 2020-12-10 | Disposition: A | Discharge status: Home / Self Care | Payer: Medicare Other | Source: Ambulatory Visit | Attending: Radiation Oncology | Admitting: Radiation Oncology

## 2020-12-10 DIAGNOSIS — Z510 Encounter for antineoplastic radiation therapy: Secondary | ICD-10-CM | POA: Diagnosis not present

## 2020-12-11 ENCOUNTER — Ambulatory Visit
Admission: RE | Admit: 2020-12-11 | Discharge: 2020-12-11 | Disposition: A | Discharge status: Home / Self Care | Payer: Medicare Other | Source: Ambulatory Visit | Attending: Radiation Oncology | Admitting: Radiation Oncology

## 2020-12-11 DIAGNOSIS — Z510 Encounter for antineoplastic radiation therapy: Secondary | ICD-10-CM | POA: Diagnosis not present

## 2021-01-14 ENCOUNTER — Other Ambulatory Visit: Payer: Self-pay | Admitting: Family Medicine

## 2021-01-14 DIAGNOSIS — E785 Hyperlipidemia, unspecified: Secondary | ICD-10-CM

## 2021-01-21 ENCOUNTER — Encounter: Payer: Self-pay | Admitting: Radiation Oncology

## 2021-01-21 ENCOUNTER — Ambulatory Visit
Admission: RE | Admit: 2021-01-21 | Discharge: 2021-01-21 | Disposition: A | Discharge status: Home / Self Care | Payer: Medicare Other | Source: Ambulatory Visit | Attending: Radiation Oncology | Admitting: Radiation Oncology

## 2021-01-21 ENCOUNTER — Other Ambulatory Visit: Payer: Self-pay

## 2021-01-21 VITALS — BP 142/74 | HR 79 | Temp 97.6°F | Wt 141.7 lb

## 2021-01-21 DIAGNOSIS — Z170 Estrogen receptor positive status [ER+]: Secondary | ICD-10-CM | POA: Diagnosis not present

## 2021-01-21 DIAGNOSIS — Z923 Personal history of irradiation: Secondary | ICD-10-CM | POA: Diagnosis not present

## 2021-01-21 DIAGNOSIS — C50411 Malignant neoplasm of upper-outer quadrant of right female breast: Secondary | ICD-10-CM | POA: Diagnosis not present

## 2021-01-21 NOTE — Progress Notes (Signed)
Radiation Oncology Follow up Note  Name: Cheryl [name]   Date:   01/21/2021 MRN:  [id number] DOB: July 01, 1950    This 70 y.o. female presents to the clinic today for 1 month follow-up status post whole breast radiation for invasive lobular carcinoma the right breast stage II (T2 N0 M0) ER positive.  REFERRING PROVIDER: Delsa Grana, PA-C  HPI: Patient is a 70 year old female now at 1 month having completed whole breast radiation to her right breast for invasive lobular carcinoma ER positive.  Seen today in routine follow-up she is doing well she feels some lumpiness in her right breast I assured her this is postsurgical changes.  She otherwise is doing well..  She has not yet started antiestrogen therapy is seeing Dr. B soon.  She specifically [place] breast tenderness cough or bone pain.  COMPLICATIONS OF TREATMENT: none  FOLLOW UP COMPLIANCE: keeps appointments   PHYSICAL EXAM:  BP (!) 142/74   Pulse 79   Temp 97.6 F (36.4 C) (Tympanic)   Wt 141 lb 11.2 oz (64.3 kg)   BMI 22.19 kg/m  Lungs are clear to A&P cardiac examination essentially unremarkable with regular rate and rhythm. No dominant mass or nodularity is noted in either breast in 2 positions examined. Incision is well-healed. No axillary or supraclavicular adenopathy is appreciated. Cosmetic result is excellent.  Well-developed well-nourished patient in NAD. HEENT reveals PERLA, EOMI, discs not visualized.  Oral cavity is clear. No oral mucosal lesions are identified. Neck is clear without evidence of cervical or supraclavicular adenopathy. Lungs are clear to A&P. Cardiac examination is essentially unremarkable with regular rate and rhythm without murmur rub or thrill. Abdomen is benign with no organomegaly or masses noted. Motor sensory and DTR levels are equal and symmetric in the upper and lower extremities. Cranial nerves II through XII are grossly intact. Proprioception is intact. No peripheral adenopathy or edema is  identified. No motor or sensory levels are noted. Crude visual fields are within normal range.  RADIOLOGY RESULTS: No current films for review  PLAN: Present time patient is recovering nicely from her radiation therapy to her right breast.  She will see medical oncology soon sure discussion about antiestrogen therapy will be discussed.  I have asked to see her back in 4 to 5 months for follow-up.  Patient knows to call with any concerns.  I would like to take this opportunity to thank you for allowing me to participate in the care of your patient.Noreene Filbert, MD

## 2021-02-13 ENCOUNTER — Telehealth: Payer: Self-pay | Admitting: *Deleted

## 2021-02-13 ENCOUNTER — Telehealth: Payer: Self-pay | Admitting: Internal Medicine

## 2021-02-13 NOTE — Telephone Encounter (Signed)
On 9/14-I left a voicemail for the patient to call us back-to schedule appointment to discuss her treatment options.  Earlier had called the patient that she did not require chemotherapy.  She still needs endocrine therapy-this will need to be discussed in the clinic.  Heather- please reach out to her with my recommendations GB

## 2021-02-13 NOTE — Telephone Encounter (Addendum)
Patient has completed radiation therapy 2 weeks ago and is asking if she needs to be on "the pill" for her breast cancer diagnosis.  Of note she does not have follow up scheduled with you

## 2021-02-13 NOTE — Telephone Encounter (Signed)
I returned patient's phone call. I requested that the patient come into the office discus starting on an Aromatase inhibitor and to follow-up with Dr. Rogue Bussing. Per Dr. Sharmaine Base note - "Patient post radiation will need to go on aromatase inhibitor therapy for total of 5 years."   I explained to patient that Dr. B needs to see her back in the office face to face or via mychart visit. Pt declined and only wants Dr. B to call her at [phone]. She is under the impression she doesn't need any medication and only followup with radiation oncology.  Dr. Jacinto Reap- please reach out to the patient.

## 2021-02-14 NOTE — Telephone Encounter (Signed)
Spoke with patient. - pt agreeable for apt on Monday 9/19 at 11 am to speak to Dr. B. She understands that Dr. Jacinto Reap recommends endocrine therapy.

## 2021-02-18 ENCOUNTER — Inpatient Hospital Stay: Payer: Medicare Other | Admitting: Internal Medicine

## 2021-02-25 ENCOUNTER — Telehealth: Payer: Self-pay | Admitting: Internal Medicine

## 2021-02-25 NOTE — Telephone Encounter (Signed)
Patient called to "speak with Dr. Jacinto Reap". I noticed that patient had missed her last appointment and offered to reschedule but she declined. She would like to discuss her treatment plan, I again advised her that a visit would probably be best in this case and she declined.

## 2021-02-25 NOTE — Telephone Encounter (Signed)
Pt called back and agreed to see dr. Jacinto Reap on Wed at 1015am

## 2021-02-25 NOTE — Telephone Encounter (Signed)
Patient cnl her apt on 02/18/21- note on the schedule at that time "she would call back to r/s"  I left a detailed vm for the patient to request call back to r/s her apts. Patient will need to be seen in the office as this is discussing her treatment plan.

## 2021-02-27 ENCOUNTER — Encounter: Payer: Self-pay | Admitting: Internal Medicine

## 2021-02-27 ENCOUNTER — Other Ambulatory Visit: Payer: Self-pay | Admitting: *Deleted

## 2021-02-27 ENCOUNTER — Inpatient Hospital Stay: Payer: Medicare Other | Attending: Internal Medicine | Admitting: Internal Medicine

## 2021-02-27 DIAGNOSIS — Z79811 Long term (current) use of aromatase inhibitors: Secondary | ICD-10-CM | POA: Insufficient documentation

## 2021-02-27 DIAGNOSIS — Z803 Family history of malignant neoplasm of breast: Secondary | ICD-10-CM | POA: Diagnosis not present

## 2021-02-27 DIAGNOSIS — Z923 Personal history of irradiation: Secondary | ICD-10-CM | POA: Diagnosis not present

## 2021-02-27 DIAGNOSIS — C50411 Malignant neoplasm of upper-outer quadrant of right female breast: Secondary | ICD-10-CM | POA: Diagnosis not present

## 2021-02-27 DIAGNOSIS — M810 Age-related osteoporosis without current pathological fracture: Secondary | ICD-10-CM | POA: Insufficient documentation

## 2021-02-27 DIAGNOSIS — Z170 Estrogen receptor positive status [ER+]: Secondary | ICD-10-CM | POA: Insufficient documentation

## 2021-02-27 DIAGNOSIS — E785 Hyperlipidemia, unspecified: Secondary | ICD-10-CM | POA: Insufficient documentation

## 2021-02-27 MED ORDER — ANASTROZOLE 1 MG PO TABS: 1.0000 mg | ORAL_TABLET | Freq: Every day | ORAL | 4 refills | Status: DC

## 2021-02-27 NOTE — Progress Notes (Signed)
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 ER-95%; /PR >5%; her 2 negative; G-2.   #S/p lumpectomy.  S/p radiation.  # SEP 28th, 2022-start anastrozole.  Procedure: Right breast lumpectomy, additional margin biopsies and three  sentinel lymph nodes.  Specimen Laterality: Right breast.  Histologic Type: Lobular.  Histologic Grade:       Glandular (Acinar)/Tubular Differentiation: 3       Nuclear Pleomorphism: 1       Mitotic Rate: 1       Overall Grade: 1  Tumor Size: 2.9 x 2 x 2 cm  Treatment Effect in the Breast: No known presurgical therapy  Margins: Final surgical margins negative for invasive lobular carcinoma.       Distance from Closest Margin (mm): 3 mm       Specify Closest Margin (required only if <68m): Superior  Regional Lymph Nodes:       Number of Lymph Nodes Examined: 5       Number of Sentinel Nodes Examined: 3       Number of Lymph Nodes with Macrometastases (>2 mm): 0       Number of Lymph Nodes with Micrometastases: 0       Number of Lymph Nodes with Isolated Tumor Cells (=0.2 mm or =200  cells): 0       Size of Largest Metastatic Deposit (mm): N/A       Extranodal Extension: N/A  Breast Biomarker Testing Performed on Previous Biopsy:       Testing Performed on Case Number: [id number]             Estrogen Receptor: 95%, positive, strong-moderate staining.              Progesterone Receptor: 5%, positive, moderate staining.             HER2: Negative with IHC.             Ki-67: 2%.    Malignant neoplasm of upper-outer quadrant of right breast in female, estrogen receptor positive ([place]  08/15/2020 Initial Diagnosis   Malignant neoplasm of upper-outer quadrant of right breast in female, estrogen receptor positive ([place]      HISTORY OF PRESENTING ILLNESS:  Cheryl [name] 70y.o.  female with right breast lobular cancer ER positive PR positive HER2 negative  is here for a follow up.  Pt is s/p radiation.  Tolerated well.  No breast pain.  Review of Systems  Constitutional:  Negative for chills, diaphoresis, fever, malaise/fatigue and weight loss.  HENT:  Negative for nosebleeds and sore throat.   Eyes:  Negative for double vision.  Respiratory:  Negative for cough, hemoptysis, sputum production, shortness of breath and wheezing.   Cardiovascular:  Negative for chest pain, palpitations, orthopnea and leg swelling.  Gastrointestinal:  Negative for abdominal pain, blood in stool, constipation, diarrhea, heartburn, melena, nausea and vomiting.  Genitourinary:  Negative for dysuria, frequency and urgency.  Musculoskeletal:  Negative for back pain and  joint pain.  Skin: Negative.  Negative for itching and rash.  Neurological:  Negative for dizziness, tingling, focal weakness, weakness and headaches.  Endo/Heme/Allergies:  Does not bruise/bleed easily.  Psychiatric/Behavioral:  Negative for depression. The patient is not nervous/anxious and does not have insomnia.     MEDICAL HISTORY:  Past Medical History:  Diagnosis Date  . Anxiety   . Breast cancer ([place]) 07/2020   right breast ILC  . Hyperlipidemia   . Osteoporosis     SURGICAL HISTORY: Past Surgical History:  Procedure Laterality Date  . BREAST BIOPSY    . BREAST LUMPECTOMY WITH RADIOACTIVE SEED AND SENTINEL LYMPH NODE BIOPSY Right 09/25/2020   Procedure: RIGHT BREAST LUMPECTOMY WITH  RADIOACTIVE SEED AND RIGHT SENTINEL LYMPH NODE BIOPSY;  Surgeon: Erroll Luna, MD;  Location: [facility];  Service: General;  Laterality: Right;    SOCIAL HISTORY: Social History   Socioeconomic History  . Marital status: Single    Spouse name: Not on file  . Number of children: 1  . Years of education: 41  . Highest education level: Not on file  Occupational History  . Occupation: retired  Tobacco Use  . Smoking status: Never  . Smokeless tobacco: Never  Vaping Use  . Vaping Use: Never used  Substance and Sexual Activity  . Alcohol use: Never  . Drug use: Never  . Sexual activity: Yes    Birth control/protection: Post-menopausal, None    Comment: One female partner off and on  Other Topics Concern  . Not on file  Social History Narrative   Pt lives with daughter and granddaughter in [place]. Worked as CMA at [employer]. Never smoked; no alcohol.    Social Determinants of Health   Financial Resource Strain: Low Risk   . Difficulty of Paying Living Expenses: Not very hard  Food Insecurity: No Food Insecurity  . Worried About Charity fundraiser in the Last Year: Never true  . Ran Out of Food in the Last Year: Never true  Transportation Needs: No Transportation Needs  . Lack of Transportation (Medical): No  . Lack of Transportation (Non-Medical): No  Physical Activity: Sufficiently Active  . Days of Exercise per Week: 3 days  . Minutes of Exercise per Session: 60 min  Stress: No Stress Concern Present  . Feeling of Stress : Only a little  Social Connections: Moderately Isolated  . Frequency of Communication with Friends and Family: More than three times a week  . Frequency of Social Gatherings with Friends and Family: More than three times a week  . Attends Religious Services: More than 4 times per year  . Active Member of Clubs or Organizations: No  . Attends Archivist Meetings: Never  . Marital Status: Widowed  Intimate Partner  Violence: Not At Risk  . Fear of Current or Ex-Partner: No  . Emotionally Abused: No  . Physically Abused: No  . Sexually Abused: No    FAMILY HISTORY: Family History  Problem Relation Age of Onset  . Breast cancer Mother 79  . Breast cancer Maternal Aunt 70  . Breast cancer Maternal Aunt 70  . Throat cancer Father   . Stroke Brother     ALLERGIES:  has No Known Allergies.  MEDICATIONS:  Current Outpatient Medications  Medication Sig Dispense Refill  . anastrozole (ARIMIDEX) 1 MG tablet Take 1 tablet (1 mg total) by mouth daily. 30 tablet 4  . ibuprofen (ADVIL) 800 MG tablet Take 1 tablet (800 mg total)  by mouth every 8 (eight) hours as needed. 30 tablet 0  . Multiple Vitamin (MULTIVITAMIN WITH MINERALS) TABS tablet Take 1 tablet by mouth daily.    . polyethylene glycol powder (GLYCOLAX/MIRALAX) 17 GM/SCOOP powder Take 1/2 capful to 1 capful (8.5- 17g) g PO by dissolving in clear liquid drink once daily for constipation 578 g 1  . rosuvastatin (CRESTOR) 10 MG tablet TAKE 1 TABLET BY MOUTH EVERYDAY AT BEDTIME 90 tablet 0  . oxyCODONE (OXY IR/ROXICODONE) 5 MG immediate release tablet Take 1 tablet (5 mg total) by mouth every 6 (six) hours as needed for severe pain. (Patient not taking: No sig reported) 15 tablet 0   No current facility-administered medications for this visit.    PHYSICAL EXAMINATION: ECOG PERFORMANCE STATUS: 0 - Asymptomatic  Vitals:   02/27/21 1028  BP: 135/80  Pulse: 83  Resp: 17  Temp: (!) 96.8 F (36 C)  SpO2: 100%   Filed Weights   02/27/21 1028  Weight: 142 lb (64.4 kg)    Physical Exam HENT:     Head: Normocephalic and atraumatic.     Mouth/Throat:     Pharynx: No oropharyngeal exudate.  Eyes:     Pupils: Pupils are equal, round, and reactive to light.  Cardiovascular:     Rate and Rhythm: Normal rate and regular rhythm.  Pulmonary:     Effort: No respiratory distress.     Breath sounds: No wheezing.  Abdominal:     General: Bowel  sounds are normal. There is no distension.     Palpations: Abdomen is soft. There is no mass.     Tenderness: There is no abdominal tenderness. There is no guarding or rebound.  Musculoskeletal:        General: No tenderness. Normal range of motion.     Cervical back: Normal range of motion and neck supple.  Skin:    General: Skin is warm.     Comments: Right breast exam-drain in place.  Incision well healing no signs of infection.;  Chaperoned by nurse.   Neurological:     Mental Status: She is alert and oriented to person, place, and time.  Psychiatric:        Mood and Affect: Affect normal.     LABORATORY DATA:  I have reviewed the data as listed Lab Results  Component Value Date   WBC 4.2 11/26/2020   HGB 11.6 (L) 11/26/2020   HCT 35.9 (L) 11/26/2020   MCV 88.0 11/26/2020   PLT 220 11/26/2020   Recent Labs    04/20/20 1127 09/21/20 1109  NA 139 138  K 5.2 4.6  CL 104 106  CO2 28 26  GLUCOSE 85 85  BUN 21 19  CREATININE 1.00* 0.99  CALCIUM 10.1 9.5  GFRNONAA 58* >60  GFRAA 68  --   PROT 7.8 7.7  ALBUMIN  --  3.8  AST 15 20  ALT 10 13  ALKPHOS  --  65  BILITOT 0.3 0.7    RADIOGRAPHIC STUDIES: I have personally reviewed the radiological images as listed and agreed with the findings in the report. No results found.   ASSESSMENT & PLAN:   Malignant neoplasm of upper-outer quadrant of right breast in female, estrogen receptor positive ([place]) #Lobular invasive breast cancer-pathologic stage IA [clinical stage II]T2N0 ER -positive; PR positive [5%]; HER2 negative grade 2.  S/p lumpectomy negative margins; Oncotype testing- RS- 22- with endocrine therapy  8% Distance recurrence.    # low risk of benefit  from chemotherapy. Will proceed with anastrozole/aromatase inhibitor therapy for total of 5 years.  Prescription sent.  Discussed mechanism of action including but not limited to musculoskeletal/hot flashes osteoporosis.  We will get bone density prior.   Ordered.  # genetics- mother- 60 sec to breast cancer; 2 aunts [mom sister]; patient would benefit from genetic evaluation.will refer to genetics  # DISPOSITION:  # refer to genetics: breast cancers # follow up in 4 weeks-:MD; no labs; BMD prior-- Dr.B     All questions were answered. The patient/family knows to call the clinic with any problems, questions or concerns.       Cammie Sickle, MD 02/27/2021 10:57 AM

## 2021-02-27 NOTE — Progress Notes (Signed)
Survivorship Care Plan visit completed.  Treatment summary reviewed and given to patient.  ASCO answers booklet reviewed and given to patient.  CARE program and Cancer Transitions discussed with patient along with other resources cancer center offers to patients and caregivers.  Patient verbalized understanding.    

## 2021-02-27 NOTE — Assessment & Plan Note (Addendum)
#  Lobular invasive breast cancer-pathologic stage IA [clinical stage II]T2N0 ER -positive; PR positive [5%]; HER2 negative grade 2.  S/p lumpectomy negative margins; Oncotype testing- RS- 22- with endocrine therapy  8% Distance recurrence.    # low risk of benefit from chemotherapy. Will proceed with anastrozole/aromatase inhibitor therapy for total of 5 years.  Prescription sent.  Discussed mechanism of action including but not limited to musculoskeletal/hot flashes osteoporosis.  We will get bone density prior.  Ordered.  # genetics- mother- 60 sec to breast cancer; 2 aunts [mom sister]; patient would benefit from genetic evaluation.will refer to genetics  # DISPOSITION:  # refer to genetics: breast cancers # follow up in 4 weeks-:MD; no labs; BMD prior-- Dr.B

## 2021-02-27 NOTE — Progress Notes (Signed)
No new concerns today 

## 2021-02-28 ENCOUNTER — Telehealth: Payer: Self-pay | Admitting: *Deleted

## 2021-02-28 NOTE — Telephone Encounter (Signed)
Patient called to clarify if prescription has already been sent to pharmacy. Pt made aware that her arimidex went to the pharmacy yesterday.

## 2021-03-20 ENCOUNTER — Other Ambulatory Visit: Payer: Self-pay

## 2021-03-20 ENCOUNTER — Ambulatory Visit
Admission: RE | Admit: 2021-03-20 | Discharge: 2021-03-20 | Disposition: A | Discharge status: Home / Self Care | Payer: Medicare Other | Source: Ambulatory Visit | Attending: Internal Medicine | Admitting: Internal Medicine

## 2021-03-20 DIAGNOSIS — Z853 Personal history of malignant neoplasm of breast: Secondary | ICD-10-CM | POA: Diagnosis not present

## 2021-03-20 DIAGNOSIS — Z923 Personal history of irradiation: Secondary | ICD-10-CM | POA: Diagnosis not present

## 2021-03-20 DIAGNOSIS — M810 Age-related osteoporosis without current pathological fracture: Secondary | ICD-10-CM | POA: Diagnosis not present

## 2021-03-20 DIAGNOSIS — Z13820 Encounter for screening for osteoporosis: Secondary | ICD-10-CM | POA: Diagnosis present

## 2021-03-20 DIAGNOSIS — Z780 Asymptomatic menopausal state: Secondary | ICD-10-CM | POA: Diagnosis not present

## 2021-03-20 DIAGNOSIS — C50411 Malignant neoplasm of upper-outer quadrant of right female breast: Secondary | ICD-10-CM

## 2021-03-20 DIAGNOSIS — Z170 Estrogen receptor positive status [ER+]: Secondary | ICD-10-CM | POA: Insufficient documentation

## 2021-03-27 ENCOUNTER — Inpatient Hospital Stay: Payer: Medicare Other | Admitting: Internal Medicine

## 2021-03-27 ENCOUNTER — Telehealth: Payer: Self-pay | Admitting: Internal Medicine

## 2021-03-27 ENCOUNTER — Other Ambulatory Visit: Payer: Self-pay | Admitting: Family Medicine

## 2021-03-27 ENCOUNTER — Telehealth: Payer: Self-pay | Admitting: *Deleted

## 2021-03-27 DIAGNOSIS — M810 Age-related osteoporosis without current pathological fracture: Secondary | ICD-10-CM

## 2021-03-27 NOTE — Telephone Encounter (Signed)
That is why she had an appt today to go over test results. Dr. B what do you want to do?

## 2021-03-27 NOTE — Telephone Encounter (Signed)
RN called pt per request for test results.  Pt had pt scheduled today for results but did not come into clinic. Rn offered pt another appointment to review test results for Wed Apr 03, 2021 at 330pm.  Pt verbalized understanding and stated she would come for appt.

## 2021-03-27 NOTE — Telephone Encounter (Signed)
Pt wants to talk with the nurse about her test result. Please call back at [phone]

## 2021-03-29 ENCOUNTER — Ambulatory Visit: Payer: Medicare Other | Admitting: Internal Medicine

## 2021-04-03 ENCOUNTER — Inpatient Hospital Stay: Payer: Medicare Other | Admitting: Internal Medicine

## 2021-04-15 ENCOUNTER — Other Ambulatory Visit: Payer: Self-pay | Admitting: Family Medicine

## 2021-04-15 ENCOUNTER — Telehealth: Payer: Self-pay | Admitting: Internal Medicine

## 2021-04-15 DIAGNOSIS — E785 Hyperlipidemia, unspecified: Secondary | ICD-10-CM

## 2021-04-15 NOTE — Telephone Encounter (Signed)
Pt aware of appt.

## 2021-04-15 NOTE — Telephone Encounter (Signed)
Pt would like to set up appt with MD. Please give her a call back at [phone]

## 2021-04-15 NOTE — Telephone Encounter (Signed)
I have tired contact pt several times to no avail so will call and see if she just wants to do a mychart or what not very reliable in keeping appts.

## 2021-04-24 ENCOUNTER — Inpatient Hospital Stay: Payer: Medicare Other | Attending: Internal Medicine | Admitting: Internal Medicine

## 2021-04-24 ENCOUNTER — Other Ambulatory Visit: Payer: Self-pay

## 2021-04-24 ENCOUNTER — Ambulatory Visit: Payer: Medicare Other | Admitting: Family Medicine

## 2021-04-24 DIAGNOSIS — E785 Hyperlipidemia, unspecified: Secondary | ICD-10-CM | POA: Insufficient documentation

## 2021-04-24 DIAGNOSIS — R232 Flushing: Secondary | ICD-10-CM | POA: Diagnosis not present

## 2021-04-24 DIAGNOSIS — Z79811 Long term (current) use of aromatase inhibitors: Secondary | ICD-10-CM | POA: Insufficient documentation

## 2021-04-24 DIAGNOSIS — Z170 Estrogen receptor positive status [ER+]: Secondary | ICD-10-CM | POA: Diagnosis not present

## 2021-04-24 DIAGNOSIS — M810 Age-related osteoporosis without current pathological fracture: Secondary | ICD-10-CM | POA: Diagnosis not present

## 2021-04-24 DIAGNOSIS — M2550 Pain in unspecified joint: Secondary | ICD-10-CM | POA: Insufficient documentation

## 2021-04-24 DIAGNOSIS — Z79899 Other long term (current) drug therapy: Secondary | ICD-10-CM | POA: Diagnosis not present

## 2021-04-24 DIAGNOSIS — Z803 Family history of malignant neoplasm of breast: Secondary | ICD-10-CM | POA: Insufficient documentation

## 2021-04-24 DIAGNOSIS — C50411 Malignant neoplasm of upper-outer quadrant of right female breast: Secondary | ICD-10-CM | POA: Diagnosis present

## 2021-04-24 DIAGNOSIS — Z801 Family history of malignant neoplasm of trachea, bronchus and lung: Secondary | ICD-10-CM | POA: Insufficient documentation

## 2021-04-24 MED ORDER — ALENDRONATE SODIUM 70 MG PO TABS: 70.0000 mg | ORAL_TABLET | ORAL | 2 refills | Status: DC

## 2021-04-24 NOTE — Assessment & Plan Note (Addendum)
#  Lobular invasive breast cancer-pathologic stage IA [clinical stage II]T2N0 ER -positive; PR positive [5%]; HER2 negative grade 2.  S/p radiation.  No chemotherapy.  On anastrozole.  #Patient on anastrozole for the last 6 weeks or so.  Tolerating well except for mild hot flashes/joint pains  #Hot flashes/joint pains-grade 1 secondary to anastrozole.  Monitor for now.  # OSETOPOROSIS: OCT 2022- T-score of -4.3. Discussed the potential risk factors for osteoporosis- age/gender/postmenopausal status/use of anti-estrogen treatments. Discussed multiple options including exercise/ calcium and vitamin D supplementation/ and also use of bisphosphonates. Discussed oral bisphosphonates versus parenteral bisphosphonate. Discussed the potential benefits and/side effects  Including but not limited to Osteonecrosis of jaw/ hypocalcemia. Previously on Fosamax.  Pt wants to try fosamax; declines reclast.  Sent a new prescription for Fosamax 70 mg once a week.  # genetics- mother- 60 sec to breast cancer; 2 aunts [mom sister]; patient would benefit from genetic evaluation. Has daughter- 47 y will refer to genetics.   # DISPOSITION:  # refer to genetics: breast cancers # follow up in 3 months-:MD; no labs;Dr.B

## 2021-04-24 NOTE — Progress Notes (Signed)
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 ER-95%; /PR >5%; her 2 negative; G-2.  #S/p lumpectomy.  S/p radiation.  Oncotype low risk.   # SEP 28th, 2022-start anastrozole.  Procedure: Right breast lumpectomy, additional margin biopsies and three  sentinel lymph nodes.  Specimen Laterality: Right breast.  Histologic Type: Lobular.  Histologic Grade:       Glandular (Acinar)/Tubular Differentiation: 3       Nuclear Pleomorphism: 1       Mitotic Rate: 1       Overall Grade: 1  Tumor Size: 2.9 x 2 x 2 cm  Treatment Effect in the Breast: No known presurgical therapy  Margins: Final surgical margins negative for invasive lobular carcinoma.       Distance from Closest Margin (mm): 3 mm       Specify Closest Margin (required only if <96m): Superior  Regional Lymph Nodes:       Number of Lymph Nodes Examined: 5       Number of Sentinel Nodes Examined: 3       Number of Lymph Nodes with Macrometastases (>2 mm): 0       Number of Lymph Nodes with Micrometastases: 0       Number of Lymph Nodes with Isolated Tumor Cells (=0.2 mm or =200  cells): 0       Size of Largest Metastatic Deposit (mm): N/A       Extranodal Extension: N/A  Breast Biomarker Testing Performed on Previous Biopsy:       Testing Performed on Case Number: [id number]             Estrogen Receptor: 95%, positive, strong-moderate staining.              Progesterone Receptor: 5%, positive, moderate staining.             HER2: Negative with IHC.             Ki-67: 2%.    Malignant neoplasm of upper-outer quadrant of right breast in female, estrogen receptor positive ([place]  08/15/2020 Initial Diagnosis   Malignant neoplasm of upper-outer quadrant of right breast in female, estrogen receptor positive ([place]      HISTORY OF PRESENTING ILLNESS:  CCordiaA [name] 70y.o.  female with right breast lobular cancer ER positive PR positive HER2 negative  on anastrazole is here for a follow up/review results of the bone density.  Patient denies any nausea vomiting abdominal pain.  No fever no chills.  No fatigue.  Complains of mild to moderate joint pains and also hot flashes.    Review of Systems  Constitutional:  Negative for chills, diaphoresis, fever, malaise/fatigue and weight loss.  HENT:  Negative for nosebleeds and sore throat.   Eyes:  Negative for double vision.  Respiratory:  Negative for cough, hemoptysis, sputum production, shortness of breath and wheezing.   Cardiovascular:  Negative for chest pain, palpitations, orthopnea and leg swelling.  Gastrointestinal:  Negative for  abdominal pain, blood in stool, constipation, diarrhea, heartburn, melena, nausea and vomiting.  Genitourinary:  Negative for dysuria, frequency and urgency.  Musculoskeletal:  Positive for back pain and myalgias. Negative for joint pain.  Skin: Negative.  Negative for itching and rash.  Neurological:  Negative for dizziness, tingling, focal weakness, weakness and headaches.  Endo/Heme/Allergies:  Does not bruise/bleed easily.  Psychiatric/Behavioral:  Negative for depression. The patient is not nervous/anxious and does not have insomnia.     MEDICAL HISTORY:  Past Medical History:  Diagnosis Date   Anxiety    Breast cancer (Cheryl [name]) 07/2020   right breast ILC   Hyperlipidemia    Osteoporosis     SURGICAL HISTORY: Past Surgical History:   Procedure Laterality Date   BREAST BIOPSY     BREAST LUMPECTOMY WITH RADIOACTIVE SEED AND SENTINEL LYMPH NODE BIOPSY Right 09/25/2020   Procedure: RIGHT BREAST LUMPECTOMY WITH RADIOACTIVE SEED AND RIGHT SENTINEL LYMPH NODE BIOPSY;  Surgeon: Erroll Luna, MD;  Location: [facility];  Service: General;  Laterality: Right;    SOCIAL HISTORY: Social History   Socioeconomic History   Marital status: Single    Spouse name: Not on file   Number of children: 1   Years of education: 12   Highest education level: Not on file  Occupational History   Occupation: retired  Tobacco Use   Smoking status: Never   Smokeless tobacco: Never  Vaping Use   Vaping Use: Never used  Substance and Sexual Activity   Alcohol use: Never   Drug use: Never   Sexual activity: Yes    Birth control/protection: Post-menopausal, None    Comment: One female partner off and on  Other Topics Concern   Not on file  Social History Narrative   Pt lives with daughter and granddaughter in [place]. Worked as CMA at [employer]. Never smoked; no alcohol.    Social Determinants of Health   Financial Resource Strain: Low Risk    Difficulty of Paying Living Expenses: Not very hard  Food Insecurity: No Food Insecurity   Worried About Charity fundraiser in the Last Year: Never true   Ran Out of Food in the Last Year: Never true  Transportation Needs: No Transportation Needs   Lack of Transportation (Medical): No   Lack of Transportation (Non-Medical): No  Physical Activity: Sufficiently Active   Days of Exercise per Week: 3 days   Minutes of Exercise per Session: 60 min  Stress: No Stress Concern Present   Feeling of Stress : Only a little  Social Connections: Moderately Isolated   Frequency of Communication with Friends and Family: More than three times a week   Frequency of Social Gatherings with Friends and Family: More than three times a week   Attends Religious Services: More than 4 times per  year   Active Member of [employer] or Organizations: No   Attends Archivist Meetings: Never   Marital Status: Widowed  Human resources officer Violence: Not At Risk   Fear of Current or Ex-Partner: No   Emotionally Abused: No   Physically Abused: No   Sexually Abused: No    FAMILY HISTORY: Family History  Problem Relation Age of Onset   Breast cancer Mother 60   Breast cancer Maternal Aunt 70   Breast cancer Maternal Aunt 70   Throat cancer Father    Stroke Brother     ALLERGIES:  has No Known Allergies.  MEDICATIONS:  Current Outpatient Medications  Medication Sig Dispense Refill  alendronate (FOSAMAX) 70 MG tablet Take 1 tablet (70 mg total) by mouth once a week. Take with a full glass of water on an empty stomach. 12 tablet 2   anastrozole (ARIMIDEX) 1 MG tablet Take 1 tablet (1 mg total) by mouth daily. 30 tablet 4   ibuprofen (ADVIL) 800 MG tablet Take 1 tablet (800 mg total) by mouth every 8 (eight) hours as needed. 30 tablet 0   Multiple Vitamin (MULTIVITAMIN WITH MINERALS) TABS tablet Take 1 tablet by mouth daily.     rosuvastatin (CRESTOR) 10 MG tablet TAKE 1 TABLET BY MOUTH EVERYDAY AT BEDTIME 90 tablet 0   oxyCODONE (OXY IR/ROXICODONE) 5 MG immediate release tablet Take 1 tablet (5 mg total) by mouth every 6 (six) hours as needed for severe pain. (Patient not taking: Reported on 01/21/2021) 15 tablet 0   polyethylene glycol powder (GLYCOLAX/MIRALAX) 17 GM/SCOOP powder Take 1/2 capful to 1 capful (8.5- 17g) g PO by dissolving in clear liquid drink once daily for constipation (Patient not taking: Reported on 04/24/2021) 578 g 1   No current facility-administered medications for this visit.    PHYSICAL EXAMINATION: ECOG PERFORMANCE STATUS: 0 - Asymptomatic  Vitals:   04/24/21 0959  BP: 129/70  Pulse: 73  Resp: 18  Temp: 98.2 F (36.8 C)  SpO2: 100%   Filed Weights   04/24/21 0958  Weight: 140 lb (63.5 kg)    Physical Exam HENT:     Head: Normocephalic  and atraumatic.     Mouth/Throat:     Pharynx: No oropharyngeal exudate.  Eyes:     Pupils: Pupils are equal, round, and reactive to light.  Cardiovascular:     Rate and Rhythm: Normal rate and regular rhythm.  Pulmonary:     Effort: No respiratory distress.     Breath sounds: No wheezing.  Abdominal:     General: Bowel sounds are normal. There is no distension.     Palpations: Abdomen is soft. There is no mass.     Tenderness: There is no abdominal tenderness. There is no guarding or rebound.  Musculoskeletal:        General: No tenderness. Normal range of motion.     Cervical back: Normal range of motion and neck supple.  Skin:    General: Skin is warm.     Comments: Right breast exam-drain in place.  Incision well healing no signs of infection.;  Chaperoned by nurse.   Neurological:     Mental Status: She is alert and oriented to person, place, and time.  Psychiatric:        Mood and Affect: Affect normal.     LABORATORY DATA:  I have reviewed the data as listed Lab Results  Component Value Date   WBC 4.2 11/26/2020   HGB 11.6 (L) 11/26/2020   HCT 35.9 (L) 11/26/2020   MCV 88.0 11/26/2020   PLT 220 11/26/2020   Recent Labs    09/21/20 1109  NA 138  K 4.6  CL 106  CO2 26  GLUCOSE 85  BUN 19  CREATININE 0.99  CALCIUM 9.5  GFRNONAA >60  PROT 7.7  ALBUMIN 3.8  AST 20  ALT 13  ALKPHOS 65  BILITOT 0.7    RADIOGRAPHIC STUDIES: I have personally reviewed the radiological images as listed and agreed with the findings in the report. No results found.   ASSESSMENT & PLAN:   Malignant neoplasm of upper-outer quadrant of right breast in female, estrogen receptor positive ([place]) #Lobular invasive  breast cancer-pathologic stage IA [clinical stage II]T2N0 ER -positive; PR positive [5%]; HER2 negative grade 2.  S/p radiation.  No chemotherapy.  On anastrozole.  #Patient on anastrozole for the last 6 weeks or so.  Tolerating well except for mild hot flashes/joint  pains  #Hot flashes/joint pains-grade 1 secondary to anastrozole.  Monitor for now.  # OSETOPOROSIS: OCT 2022- T-score of -4.3. Discussed the potential risk factors for osteoporosis- age/gender/postmenopausal status/use of anti-estrogen treatments. Discussed multiple options including exercise/ calcium and vitamin D supplementation/ and also use of bisphosphonates. Discussed oral bisphosphonates versus parenteral bisphosphonate. Discussed the potential benefits and/side effects  Including but not limited to Osteonecrosis of jaw/ hypocalcemia. Previously on Fosamax.  Pt wants to try fosamax; declines reclast.  Sent a new prescription for Fosamax 70 mg once a week.  # genetics- mother- 60 sec to breast cancer; 2 aunts [mom sister]; patient would benefit from genetic evaluation. Has daughter- 77 y will refer to genetics.   # DISPOSITION:  # refer to genetics: breast cancers # follow up in 3 months-:MD; no labs;Dr.B     All questions were answered. The patient/family knows to call the clinic with any problems, questions or concerns.       Cammie Sickle, MD 04/24/2021 12:45 PM

## 2021-04-30 ENCOUNTER — Ambulatory Visit: Payer: Medicare Other | Admitting: Family Medicine

## 2021-04-30 ENCOUNTER — Ambulatory Visit (INDEPENDENT_AMBULATORY_CARE_PROVIDER_SITE_OTHER): Payer: Medicare Other | Admitting: Family Medicine

## 2021-04-30 ENCOUNTER — Encounter: Payer: Self-pay | Admitting: Family Medicine

## 2021-04-30 VITALS — BP 140/78 | HR 77 | Temp 98.3°F | Resp 16 | Ht 67.0 in | Wt 141.4 lb

## 2021-04-30 DIAGNOSIS — R030 Elevated blood-pressure reading, without diagnosis of hypertension: Secondary | ICD-10-CM

## 2021-04-30 DIAGNOSIS — Z5181 Encounter for therapeutic drug level monitoring: Secondary | ICD-10-CM

## 2021-04-30 DIAGNOSIS — E559 Vitamin D deficiency, unspecified: Secondary | ICD-10-CM | POA: Diagnosis not present

## 2021-04-30 DIAGNOSIS — M810 Age-related osteoporosis without current pathological fracture: Secondary | ICD-10-CM

## 2021-04-30 DIAGNOSIS — Z1159 Encounter for screening for other viral diseases: Secondary | ICD-10-CM

## 2021-04-30 DIAGNOSIS — D649 Anemia, unspecified: Secondary | ICD-10-CM

## 2021-04-30 DIAGNOSIS — E785 Hyperlipidemia, unspecified: Secondary | ICD-10-CM | POA: Diagnosis not present

## 2021-04-30 MED ORDER — ROSUVASTATIN CALCIUM 10 MG PO TABS: 10.0000 mg | ORAL_TABLET | Freq: Every day | ORAL | 3 refills | Status: DC

## 2021-04-30 NOTE — Progress Notes (Signed)
Name: Cheryl [name]   MRN: [id number]    DOB: 1951/03/30   Date:04/30/2021       Progress Note  Chief Complaint  Patient presents with   Hyperlipidemia     Subjective:   Cheryl [name] is a 70 y.o. female, presents to clinic for routine f/up  Hyperlipidemia: Currently treated with crestor 10, pt reports good med compliance Last Lipids: Lab Results  Component Value Date   CHOL 184 04/20/2020   HDL 61 04/20/2020   LDLCALC 107 (H) 04/20/2020   TRIG 75 04/20/2020   CHOLHDL 3.0 04/20/2020   - Denies: Chest pain, shortness of breath, myalgias, claudication  Vit D deficiency level of 9 then >100 when last checked Osteoporosis  Newly dx breast CA  Mild anemia Hemoglobin  Date Value Ref Range Status  11/26/2020 11.6 (L) 12.0 - 15.0 g/dL Final  09/21/2020 11.4 (L) 12.0 - 15.0 g/dL Final  04/20/2020 12.0 11.7 - 15.5 g/dL Final  06/17/2019 11.6 (L) 11.7 - 15.5 g/dL Final       Current Outpatient Medications:    alendronate (FOSAMAX) 70 MG tablet, Take 1 tablet (70 mg total) by mouth once a week. Take with a full glass of water on an empty stomach., Disp: 12 tablet, Rfl: 2   anastrozole (ARIMIDEX) 1 MG tablet, Take 1 tablet (1 mg total) by mouth daily., Disp: 30 tablet, Rfl: 4   ibuprofen (ADVIL) 800 MG tablet, Take 1 tablet (800 mg total) by mouth every 8 (eight) hours as needed., Disp: 30 tablet, Rfl: 0   Multiple Vitamin (MULTIVITAMIN WITH MINERALS) TABS tablet, Take 1 tablet by mouth daily., Disp: , Rfl:    rosuvastatin (CRESTOR) 10 MG tablet, TAKE 1 TABLET BY MOUTH EVERYDAY AT BEDTIME, Disp: 90 tablet, Rfl: 0   oxyCODONE (OXY IR/ROXICODONE) 5 MG immediate release tablet, Take 1 tablet (5 mg total) by mouth every 6 (six) hours as needed for severe pain. (Patient not taking: Reported on 04/30/2021), Disp: 15 tablet, Rfl: 0   polyethylene glycol powder (GLYCOLAX/MIRALAX) 17 GM/SCOOP powder, Take 1/2 capful to 1 capful (8.5- 17g) g PO by dissolving in clear liquid drink  once daily for constipation (Patient not taking: Reported on 04/30/2021), Disp: 578 g, Rfl: 1  Patient Active Problem List   Diagnosis Date Noted   Malignant neoplasm of upper-outer quadrant of right breast in female, estrogen receptor positive ([place]) 08/15/2020   Hyperlipidemia 06/21/2019   Vitamin D deficiency 06/21/2019   Hematuria 06/21/2019   Family history of breast cancer in first degree relative 06/21/2019   Osteoporosis without current pathological fracture 06/17/2019   Left shoulder pain 06/17/2019    Past Surgical History:  Procedure Laterality Date   BREAST BIOPSY     BREAST LUMPECTOMY WITH RADIOACTIVE SEED AND SENTINEL LYMPH NODE BIOPSY Right 09/25/2020   Procedure: RIGHT BREAST LUMPECTOMY WITH RADIOACTIVE SEED AND RIGHT SENTINEL LYMPH NODE BIOPSY;  Surgeon: Erroll Luna, MD;  Location: [facility];  Service: General;  Laterality: Right;    Family History  Problem Relation Age of Onset   Breast cancer Mother 47   Breast cancer Maternal Aunt 70   Breast cancer Maternal Aunt 82   Throat cancer Father    Stroke Brother     Social History   Tobacco Use   Smoking status: Never   Smokeless tobacco: Never  Vaping Use   Vaping Use: Never used  Substance Use Topics   Alcohol use: Never   Drug use: Never  No Known Allergies  Health Maintenance  Topic Date Due   Pneumonia Vaccine 22+ Years old (1 - PCV) Never done   Hepatitis C Screening  Never done   Zoster Vaccines- Shingrix (1 of 2) Never done   MAMMOGRAM  05/11/2022   DEXA SCAN  03/21/2023   Fecal DNA (Cologuard)  06/16/2023   TETANUS/TDAP  04/20/2029   HPV VACCINES  Aged Out   INFLUENZA VACCINE  Discontinued   COVID-19 Vaccine  Discontinued    Chart Review Today: I personally reviewed active problem list, medication list, allergies, family history, social history, health maintenance, notes from last encounter, lab results, imaging with the patient/caregiver today.   Review of  Systems  Constitutional: Negative.   HENT: Negative.    Eyes: Negative.   Respiratory: Negative.    Cardiovascular: Negative.   Gastrointestinal: Negative.   Endocrine: Negative.   Genitourinary: Negative.   Musculoskeletal: Negative.   Skin: Negative.   Allergic/Immunologic: Negative.   Neurological: Negative.   Hematological: Negative.   Psychiatric/Behavioral: Negative.    All other systems reviewed and are negative.   Objective:   Vitals:   04/30/21 0941  BP: 140/78  Pulse: 77  Resp: 16  Temp: 98.3 F (36.8 C)  TempSrc: Oral  SpO2: 99%  Weight: 141 lb 6.4 oz (64.1 kg)  Height: 5\' 7"  (1.702 m)    Body mass index is 22.15 kg/m.  Physical Exam Vitals and nursing note reviewed.  Constitutional:      General: She is not in acute distress.    Appearance: Normal appearance. She is well-developed. She is not ill-appearing, toxic-appearing or diaphoretic.     Interventions: Face mask in place.  HENT:     Head: Normocephalic and atraumatic.     Right Ear: External ear normal.     Left Ear: External ear normal.  Eyes:     General: Lids are normal. No scleral icterus.       Right eye: No discharge.        Left eye: No discharge.     Conjunctiva/sclera: Conjunctivae normal.  Neck:     Trachea: Phonation normal. No tracheal deviation.  Cardiovascular:     Rate and Rhythm: Normal rate and regular rhythm.     Pulses: Normal pulses.          Radial pulses are 2+ on the right side and 2+ on the left side.       Posterior tibial pulses are 2+ on the right side and 2+ on the left side.     Heart sounds: Normal heart sounds. No murmur heard.   No friction rub. No gallop.  Pulmonary:     Effort: Pulmonary effort is normal. No respiratory distress.     Breath sounds: Normal breath sounds. No stridor. No wheezing, rhonchi or rales.  Chest:     Chest wall: No tenderness.  Abdominal:     General: Bowel sounds are normal. There is no distension.     Palpations: Abdomen is  soft.  Musculoskeletal:     Right lower leg: No edema.     Left lower leg: No edema.  Skin:    General: Skin is warm and dry.     Coloration: Skin is not jaundiced or pale.     Findings: No rash.  Neurological:     Mental Status: She is alert.     Motor: No abnormal muscle tone.     Gait: Gait normal.  Psychiatric:  Mood and Affect: Mood normal.        Speech: Speech normal.        Behavior: Behavior normal.        Assessment & Plan:     ICD-10-CM   1. Hyperlipidemia, unspecified hyperlipidemia type  E78.5 Lipid panel    COMPLETE METABOLIC PANEL WITH GFR   tolerating statin, good compliance, due for recheck of labs    2. Osteoporosis without current pathological fracture, unspecified osteoporosis type  T05.6 COMPLETE METABOLIC PANEL WITH GFR    VITAMIN D 25 Hydroxy (Vit-D Deficiency, Fractures)   on fosamax, recheck labs, recent dexa done by oncology    3. Vitamin D deficiency  P79.4 COMPLETE METABOLIC PANEL WITH GFR    VITAMIN D 25 Hydroxy (Vit-D Deficiency, Fractures)   will need to restart supplement - will advise pt on dose once labs are resulted    4. Need for hepatitis C screening test  Z11.59 Hepatitis C antibody    5. Anemia, unspecified type  D64.9 CBC with Differential/Platelet   recheck    6. Encounter for medication monitoring  Z51.81 Hepatitis C antibody    Lipid panel    COMPLETE METABOLIC PANEL WITH GFR    CBC with Differential/Platelet    7. Elevated BP without diagnosis of hypertension  R03.0    monitor BP - may need to start meds if it remains >140         Return in about 6 months (around 10/28/2021) for Routine follow-up.   Delsa Grana, PA-C 04/30/21 9:48 AM

## 2021-05-01 LAB — HEPATITIS C ANTIBODY
Hepatitis C Ab: NONREACTIVE
SIGNAL TO CUT-OFF: 0.05 (ref <1.00)

## 2021-05-01 LAB — CBC WITH DIFFERENTIAL/PLATELET
Absolute Monocytes: 519 cells/uL (ref 200–950)
Basophils Absolute: 20 cells/uL (ref 0–200)
Basophils Relative: 0.5 %
Eosinophils Absolute: 78 cells/uL (ref 15–500)
Eosinophils Relative: 2 %
HCT: 36.4 % (ref 35.0–45.0)
Hemoglobin: 11.8 g/dL (ref 11.7–15.5)
Lymphs Abs: 1178 cells/uL (ref 850–3900)
MCH: 29 pg (ref 27.0–33.0)
MCHC: 32.4 g/dL (ref 32.0–36.0)
MCV: 89.4 fL (ref 80.0–100.0)
MPV: 9.5 fL (ref 7.5–12.5)
Monocytes Relative: 13.3 %
Neutro Abs: 2106 cells/uL (ref 1500–7800)
Neutrophils Relative %: 54 %
Platelets: 217 10*3/uL (ref 140–400)
RBC: 4.07 10*6/uL (ref 3.80–5.10)
RDW: 14.2 % (ref 11.0–15.0)
Total Lymphocyte: 30.2 %
WBC: 3.9 10*3/uL (ref 3.8–10.8)

## 2021-05-01 LAB — COMPLETE METABOLIC PANEL WITH GFR
AG Ratio: 1.2 (calc) (ref 1.0–2.5)
ALT: 12 U/L (ref 6–29)
AST: 17 U/L (ref 10–35)
Albumin: 4 g/dL (ref 3.6–5.1)
Alkaline phosphatase (APISO): 76 U/L (ref 37–153)
BUN: 16 mg/dL (ref 7–25)
CO2: 27 mmol/L (ref 20–32)
Calcium: 9.9 mg/dL (ref 8.6–10.4)
Chloride: 107 mmol/L (ref 98–110)
Creat: 0.94 mg/dL (ref 0.50–1.05)
Globulin: 3.3 g/dL (calc) (ref 1.9–3.7)
Glucose, Bld: 91 mg/dL (ref 65–99)
Potassium: 5 mmol/L (ref 3.5–5.3)
Sodium: 142 mmol/L (ref 135–146)
Total Bilirubin: 0.3 mg/dL (ref 0.2–1.2)
Total Protein: 7.3 g/dL (ref 6.1–8.1)
eGFR: 66 mL/min/{1.73_m2} (ref >=60)

## 2021-05-01 LAB — LIPID PANEL
Cholesterol: 163 mg/dL (ref <200)
HDL: 56 mg/dL (ref >=50)
LDL Cholesterol (Calc): 90 mg/dL (calc)
Non-HDL Cholesterol (Calc): 107 mg/dL (calc) (ref <130)
Total CHOL/HDL Ratio: 2.9 (calc) (ref <5.0)
Triglycerides: 83 mg/dL (ref <150)

## 2021-05-01 LAB — VITAMIN D 25 HYDROXY (VIT D DEFICIENCY, FRACTURES): Vit D, 25-Hydroxy: 41 ng/mL (ref 30–100)

## 2021-05-02 ENCOUNTER — Telehealth: Payer: Self-pay

## 2021-05-02 NOTE — Telephone Encounter (Signed)
Copied from [place] [id number]. Topic: General - Other >> May 02, 2021 11:57 AM Yvette Rack wrote: Reason for CRM: Pt requests call back regarding most recent lab results, blood pressure, and weight. Cb# [phone]

## 2021-05-02 NOTE — Telephone Encounter (Signed)
Called pt to inform partial labs have been reviwed but some are still pending. Advised we will give a call once Cheryl [name] has reviewed all of her labs.

## 2021-05-03 ENCOUNTER — Other Ambulatory Visit: Payer: Self-pay | Admitting: Internal Medicine

## 2021-05-09 ENCOUNTER — Inpatient Hospital Stay: Payer: Medicare Other

## 2021-05-09 ENCOUNTER — Inpatient Hospital Stay: Payer: Medicare Other | Attending: Internal Medicine | Admitting: Licensed Clinical Social Worker

## 2021-06-24 ENCOUNTER — Ambulatory Visit: Payer: Medicare Other | Admitting: Radiation Oncology

## 2021-07-10 ENCOUNTER — Ambulatory Visit: Payer: Medicare Other | Admitting: Radiation Oncology

## 2021-07-25 ENCOUNTER — Inpatient Hospital Stay: Payer: Medicare Other | Attending: Internal Medicine | Admitting: Internal Medicine

## 2021-07-25 ENCOUNTER — Encounter: Payer: Self-pay | Admitting: Internal Medicine

## 2021-07-25 ENCOUNTER — Ambulatory Visit
Admission: RE | Admit: 2021-07-25 | Discharge: 2021-07-25 | Disposition: A | Discharge status: Home / Self Care | Payer: Medicare Other | Source: Ambulatory Visit | Attending: Radiation Oncology | Admitting: Radiation Oncology

## 2021-07-25 ENCOUNTER — Other Ambulatory Visit: Payer: Self-pay

## 2021-07-25 DIAGNOSIS — C50411 Malignant neoplasm of upper-outer quadrant of right female breast: Secondary | ICD-10-CM

## 2021-07-25 DIAGNOSIS — M2550 Pain in unspecified joint: Secondary | ICD-10-CM | POA: Insufficient documentation

## 2021-07-25 DIAGNOSIS — R232 Flushing: Secondary | ICD-10-CM | POA: Insufficient documentation

## 2021-07-25 DIAGNOSIS — Z803 Family history of malignant neoplasm of breast: Secondary | ICD-10-CM | POA: Insufficient documentation

## 2021-07-25 DIAGNOSIS — Z808 Family history of malignant neoplasm of other organs or systems: Secondary | ICD-10-CM | POA: Insufficient documentation

## 2021-07-25 DIAGNOSIS — Z923 Personal history of irradiation: Secondary | ICD-10-CM | POA: Insufficient documentation

## 2021-07-25 DIAGNOSIS — Z170 Estrogen receptor positive status [ER+]: Secondary | ICD-10-CM | POA: Insufficient documentation

## 2021-07-25 DIAGNOSIS — Z79811 Long term (current) use of aromatase inhibitors: Secondary | ICD-10-CM | POA: Insufficient documentation

## 2021-07-25 NOTE — Progress Notes (Signed)
Radiation Oncology Follow up Note  Name: Cheryl [name]   Date:   07/25/2021 MRN:  [id number] DOB: 09/24/1950    This 71 y.o. female presents to the clinic today for 55-month follow-up status post whole breast radiation for invasive lobular carcinoma the right breast stage II (T2 N0 M0).  REFERRING PROVIDER: Delsa Grana, PA-C  HPI: Patient is a 71 year old female now out 6 months having completed whole breast radiation to her right breast for invasive lobular carcinoma ER positive seen today in routine follow-up she is doing well.  She specifically denies breast tenderness cough or bone pain..  She has follow-up mammogram scheduled for next month.  She is currently on Arimidex tolerating it well without side effects.  COMPLICATIONS OF TREATMENT: none  FOLLOW UP COMPLIANCE: keeps appointments   PHYSICAL EXAM:  There were no vitals taken for this visit. Lungs are clear to A&P cardiac examination essentially unremarkable with regular rate and rhythm. No dominant mass or nodularity is noted in either breast in 2 positions examined. Incision is well-healed. No axillary or supraclavicular adenopathy is appreciated. Cosmetic result is excellent.  Well-developed well-nourished patient in NAD. HEENT reveals PERLA, EOMI, discs not visualized.  Oral cavity is clear. No oral mucosal lesions are identified. Neck is clear without evidence of cervical or supraclavicular adenopathy. Lungs are clear to A&P. Cardiac examination is essentially unremarkable with regular rate and rhythm without murmur rub or thrill. Abdomen is benign with no organomegaly or masses noted. Motor sensory and DTR levels are equal and symmetric in the upper and lower extremities. Cranial nerves II through XII are grossly intact. Proprioception is intact. No peripheral adenopathy or edema is identified. No motor or sensory levels are noted. Crude visual fields are within normal range.  RADIOLOGY RESULTS: We will review her mammograms  when they become available  PLAN: Present time patient is doing well 6 months out with no evidence of disease and pleased with her overall progress.  Of asked to see her back in 6 months for follow-up.  She continues on Arimidex without side effect.  Patient knows to call with any concerns.  I would like to take this opportunity to thank you for allowing me to participate in the care of your patient.Noreene Filbert, MD

## 2021-07-25 NOTE — Progress Notes (Signed)
Patient having sharp pain and soreness on right breast area.  Noticed a knot on right upper abdomen last week.  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

## 2021-07-25 NOTE — Progress Notes (Signed)
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 ER-95%; /PR >5%; her 2 negative; G-2.  #S/p lumpectomy [Dr.Cornetto].  S/p radiation.  Oncotype low risk.   # SEP 28th, 2022-start anastrozole.  # OSTEOPOROS t score= -4.1; on fosomax   # genetics- mother- 60 sec to breast cancer; 2 aunts [mom sister];-daughter 70 y- declines.  Procedure: Right breast lumpectomy, additional margin biopsies and three  sentinel lymph nodes.  Specimen Laterality: Right breast.  Histologic Type: Lobular.  Histologic Grade:       Glandular ([place])/Tubular Differentiation: 3       Nuclear Pleomorphism: 1       Mitotic Rate: 1       Overall Grade: 1  Tumor Size: 2.9 x 2 x 2 cm  Treatment Effect in the Breast: No known presurgical therapy  Margins: Final surgical margins negative for invasive lobular carcinoma.       Distance from Closest Margin (mm): 3 mm       Specify Closest Margin (required only if <68m): Superior  Regional Lymph Nodes:       Number of Lymph Nodes Examined: 5       Number of Sentinel Nodes Examined: 3       Number of Lymph Nodes with Macrometastases (>2 mm): 0       Number of Lymph Nodes with Micrometastases: 0       Number of Lymph Nodes with Isolated Tumor Cells (=0.2 mm or =200  cells): 0       Size of Largest Metastatic Deposit (mm): N/A       Extranodal Extension: N/A  Breast Biomarker Testing  Performed on Previous Biopsy:       Testing Performed on Case Number: [id number]             Estrogen Receptor: 95%, positive, strong-moderate staining.             Progesterone Receptor: 5%, positive, moderate staining.             HER2: Negative with IHC.             Ki-67: 2%.    Malignant neoplasm of upper-outer quadrant of right breast in female, estrogen receptor positive ([facility]  08/15/2020 Initial Diagnosis   Malignant neoplasm of upper-outer quadrant of right breast in female, estrogen receptor positive ([facility]      HISTORY OF PRESENTING ILLNESS: Alone.  Ambulating independently.  [name] 71y.o.  female with right breast lobular cancer ER positive PR positive HER2 negative  on anastrazole is here for a follow up.  Patient complains of mild intermittent right breast tenderness especially at nighttime.  Last for only few seconds.  Happens multiple times in a day.  Patient denies any nausea vomiting abdominal pain.  No fever no chills.  No fatigue. Complains of mild to moderate joint pains and also hot flashes.    Review of Systems  Constitutional:  Negative for chills, diaphoresis, fever, malaise/fatigue and weight loss.  HENT:  Negative for nosebleeds and sore throat.   Eyes:  Negative for double vision.  Respiratory:  Negative for cough, hemoptysis, sputum production, shortness of breath and wheezing.   Cardiovascular:  Negative for chest pain, palpitations, orthopnea and leg swelling.  Gastrointestinal:  Negative for abdominal pain, blood in stool, constipation, diarrhea, heartburn, melena, nausea and vomiting.  Genitourinary:  Negative for dysuria, frequency and urgency.  Musculoskeletal:  Positive for back pain and myalgias. Negative for joint pain.  Skin: Negative.  Negative for itching and rash.  Neurological:  Negative for dizziness, tingling, focal weakness, weakness and headaches.  Endo/Heme/Allergies:  Does not bruise/bleed easily.  Psychiatric/Behavioral:   Negative for depression. The patient is not nervous/anxious and does not have insomnia.     MEDICAL HISTORY:  Past Medical History:  Diagnosis Date   Anxiety    Breast cancer ([facility]) 07/2020   right breast ILC   Hyperlipidemia    Osteoporosis     SURGICAL HISTORY: Past Surgical History:  Procedure Laterality Date   BREAST BIOPSY     BREAST LUMPECTOMY WITH RADIOACTIVE SEED AND SENTINEL LYMPH NODE BIOPSY Right 09/25/2020   Procedure: RIGHT BREAST LUMPECTOMY WITH RADIOACTIVE SEED AND RIGHT SENTINEL LYMPH NODE BIOPSY;  Surgeon: Erroll Luna, MD;  Location: [facility];  Service: General;  Laterality: Right;    SOCIAL HISTORY: Social History   Socioeconomic History   Marital status: Single    Spouse name: Not on file   Number of children: 1   Years of education: 12   Highest education level: Not on file  Occupational History   Occupation: retired  Tobacco Use   Smoking status: Never   Smokeless tobacco: Never  Vaping Use   Vaping Use: Never used  Substance and Sexual Activity   Alcohol use: Never   Drug use: Never   Sexual activity: Yes    Birth control/protection: Post-menopausal, None    Comment: One female partner off and on  Other Topics Concern   Not on file  Social History Narrative   Pt lives with daughter and granddaughter in [place]. Worked as CMA at [employer]. Never smoked; no alcohol.    Social Determinants of Health   Financial Resource Strain: Low Risk    Difficulty of Paying Living Expenses: Not very hard  Food Insecurity: No Food Insecurity   Worried About Charity fundraiser in the Last Year: Never true   Ran Out of Food in the Last Year: Never true  Transportation Needs: No Transportation Needs   Lack of Transportation (Medical): No   Lack of Transportation (Non-Medical): No  Physical Activity: Sufficiently Active   Days of Exercise per Week: 3 days   Minutes of Exercise per Session: 60 min  Stress: No Stress Concern Present    Feeling of Stress : Only a little  Social Connections: Moderately Isolated   Frequency of Communication with Friends and Family: More than three times a week   Frequency of Social Gatherings with Friends and Family: More than three times a week   Attends Religious Services: More than 4 times per year   Active Member of [employer] or Organizations: No   Attends Archivist Meetings: Never   Marital Status: Widowed  Human resources officer Violence: Not At Risk   Fear of Current or Ex-Partner: No   Emotionally Abused: No   Physically Abused: No   Sexually Abused: No    FAMILY HISTORY: Family History  Problem Relation Age of Onset   Breast  cancer Mother 79   Breast cancer Maternal Aunt 36   Breast cancer Maternal Aunt 70   Throat cancer Father    Stroke Brother     ALLERGIES:  has No Known Allergies.  MEDICATIONS:  Current Outpatient Medications  Medication Sig Dispense Refill   alendronate (FOSAMAX) 70 MG tablet Take 1 tablet (70 mg total) by mouth once a week. Take with a full glass of water on an empty stomach. 12 tablet 2   anastrozole (ARIMIDEX) 1 MG tablet TAKE 1 TABLET BY MOUTH EVERY DAY 90 tablet 1   ibuprofen (ADVIL) 800 MG tablet Take 1 tablet (800 mg total) by mouth every 8 (eight) hours as needed. 30 tablet 0   Multiple Vitamin (MULTIVITAMIN WITH MINERALS) TABS tablet Take 1 tablet by mouth daily.     polyethylene glycol powder (GLYCOLAX/MIRALAX) 17 GM/SCOOP powder Take 1/2 capful to 1 capful (8.5- 17g) g PO by dissolving in clear liquid drink once daily for constipation 578 g 1   rosuvastatin (CRESTOR) 10 MG tablet Take 1 tablet (10 mg total) by mouth daily. 90 tablet 3   oxyCODONE (OXY IR/ROXICODONE) 5 MG immediate release tablet Take 1 tablet (5 mg total) by mouth every 6 (six) hours as needed for severe pain. (Patient not taking: Reported on 04/30/2021) 15 tablet 0   No current facility-administered medications for this visit.    PHYSICAL EXAMINATION: ECOG  PERFORMANCE STATUS: 0 - Asymptomatic  Vitals:   07/25/21 0900  BP: 137/75  Pulse: 74  Temp: 98.2 F (36.8 C)   Filed Weights   07/25/21 0900  Weight: 140 lb (63.5 kg)   Right BREAST exam [in the presence of nurse]- no unusual skin changes or dominant masses felt. Surgical scars noted.    Physical Exam HENT:     Head: Normocephalic and atraumatic.     Mouth/Throat:     Pharynx: No oropharyngeal exudate.  Eyes:     Pupils: Pupils are equal, round, and reactive to light.  Cardiovascular:     Rate and Rhythm: Normal rate and regular rhythm.  Pulmonary:     Effort: No respiratory distress.     Breath sounds: No wheezing.  Abdominal:     General: Bowel sounds are normal. There is no distension.     Palpations: Abdomen is soft. There is no mass.     Tenderness: There is no abdominal tenderness. There is no guarding or rebound.  Musculoskeletal:        General: No tenderness. Normal range of motion.     Cervical back: Normal range of motion and neck supple.  Skin:    General: Skin is warm.  Neurological:     Mental Status: She is alert and oriented to person, place, and time.  Psychiatric:        Mood and Affect: Affect normal.     LABORATORY DATA:  I have reviewed the data as listed Lab Results  Component Value Date   WBC 3.9 04/30/2021   HGB 11.8 04/30/2021   HCT 36.4 04/30/2021   MCV 89.4 04/30/2021   PLT 217 04/30/2021   Recent Labs    09/21/20 1109 04/30/21 1013  NA 138 142  K 4.6 5.0  CL 106 107  CO2 26 27  GLUCOSE 85 91  BUN 19 16  CREATININE 0.99 0.94  CALCIUM 9.5 9.9  GFRNONAA >60  --   PROT 7.7 7.3  ALBUMIN 3.8  --   AST 20 17  ALT 13 12  ALKPHOS  65  --   BILITOT 0.7 0.3    RADIOGRAPHIC STUDIES: I have personally reviewed the radiological images as listed and agreed with the findings in the report. No results found.   ASSESSMENT & PLAN:   Malignant neoplasm of upper-outer quadrant of right breast in female, estrogen receptor positive  ([place]) #Lobular invasive breast cancer-pathologic stage IA [clinical stage II; Dr.Conetto;[place]]T2N0 ER -positive; PR positive [5%]; HER2 negative grade 2.  S/p radiation.  No chemotherapy.  On adjuvant anastrozole [SEP 2022]. STABLE. wil repeat Mammo in May 2023.   # Continue Anastrazole for 5-10 years.   #Hot flashes/joint pains-grade 1 secondary to anastrozole; STABLE.   #Right breast tenderness -postlumpectomy/radiation-no concerns for any recurrent disease.  Await mammogram May 2023.  # OSETOPOROSIS: OCT 2022- T-score of -4.3; will repeat in Oct 2024; continue Fosamax 70 mg once a week' [place] well- STABLE.  # genetics- mother- 60 sec to breast cancer; 2 aunts [mom sister];-daughter 27 y- declines.  # DISPOSITION:  # follow up in 4 months-:MD; no labs; prior- [facility] mammo-[facility];Dr.B     All questions were answered. The patient/family knows to call the clinic with any problems, questions or concerns.       Cammie Sickle, MD 07/25/2021 4:58 PM

## 2021-07-25 NOTE — Assessment & Plan Note (Addendum)
#  Lobular invasive breast cancer-pathologic stage IA [clinical stage II; Dr.Conetto;[place]]T2N0 ER -positive; PR positive [5%]; HER2 negative grade 2.  S/p radiation.  No chemotherapy.  On adjuvant anastrozole [SEP 2022]. STABLE. wil repeat Mammo in May 2023.   # Continue Anastrazole for 5-10 years.   #Hot flashes/joint pains-grade 1 secondary to anastrozole; STABLE.   #Right breast tenderness -postlumpectomy/radiation-no concerns for any recurrent disease.  Await mammogram May 2023.  # OSETOPOROSIS: OCT 2022- T-score of -4.3; will repeat in Oct 2024; continue Fosamax 70 mg once a week' [place] well- STABLE.  # genetics- mother- 60 sec to breast cancer; 2 aunts [mom sister];-daughter 43 y- declines.  # DISPOSITION:  # follow up in 4 months-:MD; no labs; prior- [facility] mamm[facility];Dr.B

## 2021-08-01 ENCOUNTER — Ambulatory Visit: Payer: Medicare Other | Admitting: Internal Medicine

## 2021-08-19 NOTE — Progress Notes (Incomplete)
? ?08/19/21 ?4:55 PM  ? ?Everline A Brabender ?12/13/1950 ?[id number] ? ?Referring provider:  ?Delsa Grana, PA-C ?[address] ?[address] ?[place],  [place] [address] ?No chief complaint on file. ? ? ? ?HPI: ?Cheryl [name] is a 71 y.o.female with a personal history of microscopic hematuria who presents today for a 1 year follow-up with annual UA.  ? ? ? ? ? ? ? ?PMH: ?Past Medical History:  ?Diagnosis Date  ? Anxiety   ? Breast cancer ([place]) 07/2020  ? right breast ILC  ? Hyperlipidemia   ? Osteoporosis   ? ? ?Surgical History: ?Past Surgical History:  ?Procedure Laterality Date  ? BREAST BIOPSY    ? BREAST LUMPECTOMY WITH RADIOACTIVE SEED AND SENTINEL LYMPH NODE BIOPSY Right 09/25/2020  ? Procedure: RIGHT BREAST LUMPECTOMY WITH RADIOACTIVE SEED AND RIGHT SENTINEL LYMPH NODE BIOPSY;  Surgeon: Erroll Luna, MD;  Location: [facility];  Service: General;  Laterality: Right;  ? ? ?Home Medications:  ?Allergies as of 08/20/2021   ?No Known Allergies ?  ? ?  ?Medication List  ?  ? ?  ? Accurate as of August 19, 2021  4:55 PM. If you have any questions, ask your nurse or doctor.  ?  ?  ? ?  ? ?alendronate 70 MG tablet ?Commonly known as: Fosamax ?Take 1 tablet (70 mg total) by mouth once a week. Take with a full glass of water on an empty stomach. ?  ?anastrozole 1 MG tablet ?Commonly known as: ARIMIDEX ?TAKE 1 TABLET BY MOUTH EVERY DAY ?  ?ibuprofen 800 MG tablet ?Commonly known as: ADVIL ?Take 1 tablet (800 mg total) by mouth every 8 (eight) hours as needed. ?  ?multivitamin with minerals Tabs tablet ?Take 1 tablet by mouth daily. ?  ?oxyCODONE 5 MG immediate release tablet ?Commonly known as: Oxy IR/ROXICODONE ?Take 1 tablet (5 mg total) by mouth every 6 (six) hours as needed for severe pain. ?  ?polyethylene glycol powder 17 GM/SCOOP powder ?Commonly known as: GLYCOLAX/MIRALAX ?Take 1/2 capful to 1 capful (8.5- 17g) g PO by dissolving in clear liquid drink once daily for constipation ?  ?rosuvastatin 10 MG  tablet ?Commonly known as: CRESTOR ?Take 1 tablet (10 mg total) by mouth daily. ?  ? ?  ? ? ?Allergies: No Known Allergies ? ?Family History: ?Family History  ?Problem Relation Age of Onset  ? Breast cancer Mother 56  ? Breast cancer Maternal Aunt 54  ? Breast cancer Maternal Aunt 62  ? Throat cancer Father   ? Stroke Brother   ? ? ?Social History:  reports that she has never smoked. She has never used smokeless tobacco. She reports that she does not drink alcohol and does not use drugs. ? ? ?Physical Exam: ?There were no vitals taken for this visit.  ?Constitutional:  Alert and oriented, No acute distress. ?HEENT: [place] AT, moist mucus membranes.  Trachea midline, no masses. ?Cardiovascular: No clubbing, cyanosis, or edema. ?Respiratory: Normal respiratory effort, no increased work of breathing. ?Skin: No rashes, bruises or suspicious lesions. ?Neurologic: Grossly intact, no focal deficits, moving all 4 extremities. ?Psychiatric: Normal mood and affect. ? ?Laboratory Data: ?Lab Results  ?Component Value Date  ? CREATININE 0.94 04/30/2021  ? ?Lab Results  ?Component Value Date  ? HGBA1C 5.6 06/17/2019  ? ? ?Urinalysis ? ? ?Pertinent Imaging: ? ? ? ?Assessment & Plan:   ? ? ?No follow-ups on file. ? ?I,Kailey Littlejohn,acting as a scribe for Hollice Espy, MD.,have documented all  relevant documentation on the behalf of Hollice Espy, MD,as directed by  Hollice Espy, MD while in the presence of Hollice Espy, MD. ? ? ?[facility] ?[address], [address] ?[place], [place] [address] ?[phone] ? ?

## 2021-08-20 ENCOUNTER — Ambulatory Visit: Payer: Self-pay | Admitting: Urology

## 2021-08-21 ENCOUNTER — Inpatient Hospital Stay: Admission: RE | Admit: 2021-08-21 | Payer: Medicare Other | Source: Ambulatory Visit

## 2021-08-21 ENCOUNTER — Other Ambulatory Visit: Payer: Medicare Other

## 2021-08-27 ENCOUNTER — Telehealth: Payer: Self-pay | Admitting: *Deleted

## 2021-08-27 NOTE — Telephone Encounter (Signed)
Patient called reporting that the Anastrozole is causing a lot of hair loss as well as stomach cramps and dizziness. She was asking about changing to a different AI. I did let her know that they all have side effects of hair loss, she than asked what would happen if she did not take it. I explained how the medicine works by blocking hormones that feed cancer cells. She sounded undecided about treatment. Her Oncotype recurrence score is 22. Please advise ?

## 2021-09-03 ENCOUNTER — Ambulatory Visit: Payer: Medicare Other | Admitting: Family

## 2021-09-13 ENCOUNTER — Ambulatory Visit: Payer: Medicare Other | Admitting: Family

## 2021-09-17 ENCOUNTER — Ambulatory Visit: Payer: Medicare Other | Admitting: Family

## 2021-09-18 ENCOUNTER — Ambulatory Visit
Admission: RE | Admit: 2021-09-18 | Discharge: 2021-09-18 | Disposition: A | Discharge status: Home / Self Care | Payer: Medicare Other | Source: Ambulatory Visit | Attending: Internal Medicine | Admitting: Internal Medicine

## 2021-09-18 DIAGNOSIS — Z170 Estrogen receptor positive status [ER+]: Secondary | ICD-10-CM

## 2021-09-18 DIAGNOSIS — C50411 Malignant neoplasm of upper-outer quadrant of right female breast: Secondary | ICD-10-CM | POA: Diagnosis present

## 2021-10-08 ENCOUNTER — Ambulatory Visit: Payer: Medicare Other

## 2021-11-21 ENCOUNTER — Encounter: Payer: Self-pay | Admitting: Internal Medicine

## 2021-11-21 ENCOUNTER — Inpatient Hospital Stay: Payer: Medicare Other | Attending: Internal Medicine | Admitting: Internal Medicine

## 2021-12-10 ENCOUNTER — Encounter (HOSPITAL_COMMUNITY): Payer: Self-pay

## 2022-01-08 ENCOUNTER — Ambulatory Visit: Payer: Medicare Other | Admitting: Urology

## 2022-01-09 ENCOUNTER — Ambulatory Visit: Payer: Medicare Other

## 2022-01-09 DIAGNOSIS — Z0000 Encounter for general adult medical examination without abnormal findings: Secondary | ICD-10-CM

## 2022-01-09 NOTE — Progress Notes (Signed)
Subjective:  I connected with  Cheryl [name] on 01/09/22 by a audio enabled telemedicine application and verified that I am speaking with the correct person using two identifiers.  Patient Location: Home  Provider Location: Office/Clinic  I discussed the limitations of evaluation and management by telemedicine. The patient expressed understanding and agreed to proceed.  Cheryl [name] is a 71 y.o. female who presents for Medicare Annual (Subsequent) preventive examination.  Review of Systems    Defer to PCP       Objective:    There were no vitals filed for this visit. There is no height or weight on file to calculate BMI.     07/25/2021    9:55 AM 10/09/2020    1:21 PM 10/04/2020    9:43 AM 09/25/2020    6:37 AM 09/17/2020   11:45 AM 08/29/2020    2:13 PM 09/30/2019    9:43 AM  Advanced Directives  Does Patient Have a Medical Advance Directive? No No No No No No No  Would patient like information on creating a medical advance directive? No - Patient declined No - Patient declined No - Patient declined No - Patient declined No - Patient declined No - Patient declined Yes (MAU/Ambulatory/Procedural Areas - Information given)    Current Medications (verified) Outpatient Encounter Medications as of 01/09/2022  Medication Sig   alendronate (FOSAMAX) 70 MG tablet Take 1 tablet (70 mg total) by mouth once a week. Take with a full glass of water on an empty stomach.   anastrozole (ARIMIDEX) 1 MG tablet TAKE 1 TABLET BY MOUTH EVERY DAY   ibuprofen (ADVIL) 800 MG tablet Take 1 tablet (800 mg total) by mouth every 8 (eight) hours as needed.   Multiple Vitamin (MULTIVITAMIN WITH MINERALS) TABS tablet Take 1 tablet by mouth daily.   oxyCODONE (OXY IR/ROXICODONE) 5 MG immediate release tablet Take 1 tablet (5 mg total) by mouth every 6 (six) hours as needed for severe pain. (Patient not taking: Reported on 04/30/2021)   polyethylene glycol powder (GLYCOLAX/MIRALAX) 17 GM/SCOOP powder Take  1/2 capful to 1 capful (8.5- 17g) g PO by dissolving in clear liquid drink once daily for constipation   rosuvastatin (CRESTOR) 10 MG tablet Take 1 tablet (10 mg total) by mouth daily.   No facility-administered encounter medications on file as of 01/09/2022.    Allergies (verified) Patient has no known allergies.   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   Hyperlipidemia    Osteoporosis    Past Surgical History:  Procedure Laterality Date   BREAST BIOPSY Right 08/08/2020   U/S bx- GRADE II INVASIVE MAMMARY CARCINOMA   BREAST LUMPECTOMY Right 09/25/2020   Invasive lobular carcinoma   BREAST LUMPECTOMY WITH RADIOACTIVE SEED AND SENTINEL LYMPH NODE BIOPSY Right 09/25/2020   Procedure: RIGHT BREAST LUMPECTOMY WITH RADIOACTIVE SEED AND RIGHT SENTINEL LYMPH NODE BIOPSY;  Surgeon: Erroll Luna, MD;  Location: [facility];  Service: General;  Laterality: Right;   Family History  Problem Relation Age of Onset   Breast cancer Mother 74   Breast cancer Maternal Aunt 70   Breast cancer Maternal Aunt 70   Throat cancer Father    Stroke Brother    Social History   Socioeconomic History   Marital status: Single    Spouse name: Not on file   Number of children: 1   Years of education: 26  Highest education level: Not on file  Occupational History   Occupation: retired  Tobacco Use   Smoking status: Never   Smokeless tobacco: Never  Vaping Use   Vaping Use: Never used  Substance and Sexual Activity   Alcohol use: Never   Drug use: Never   Sexual activity: Yes    Birth control/protection: Post-menopausal, None    Comment: One female partner off and on  Other Topics Concern   Not on file  Social History Narrative   Pt lives with daughter and granddaughter in [place]. Worked as CMA at [employer]. Never smoked; no alcohol.    Social Determinants of Health   Financial Resource Strain: Low  Risk  (10/04/2020)   Overall Financial Resource Strain (CARDIA)    Difficulty of Paying Living Expenses: Not very hard  Food Insecurity: No Food Insecurity (10/04/2020)   Hunger Vital Sign    Worried About Running Out of Food in the Last Year: Never true    Ran Out of Food in the Last Year: Never true  Transportation Needs: No Transportation Needs (10/04/2020)   PRAPARE - Hydrologist (Medical): No    Lack of Transportation (Non-Medical): No  Physical Activity: Sufficiently Active (10/04/2020)   Exercise Vital Sign    Days of Exercise per Week: 3 days    Minutes of Exercise per Session: 60 min  Stress: No Stress Concern Present (10/04/2020)   [place]    Feeling of Stress : Only a little  Social Connections: Moderately Isolated (10/04/2020)   Social Connection and Isolation Panel [NHANES]    Frequency of Communication with Friends and Family: More than three times a week    Frequency of Social Gatherings with Friends and Family: More than three times a week    Attends Religious Services: More than 4 times per year    Active Member of [employer] or Organizations: No    Attends Archivist Meetings: Never    Marital Status: Widowed    Tobacco Counseling Counseling given: Not Answered   Clinical Intake:                 Diabetic?No         Activities of Daily Living    04/30/2021    9:28 AM  In your present state of health, do you have any difficulty performing the following activities:  Hearing? 0  Vision? 0  Difficulty concentrating or making decisions? 0  Walking or climbing stairs? 1  Dressing or bathing? 0  Doing errands, shopping? 0    Patient Care Team: Delsa Grana, PA-C as PCP - General (Family Medicine) Rico Junker, RN as Registered Nurse Noreene Filbert, MD as Referring Physician (Radiation Oncology) Erroll Luna, MD as Consulting Physician  (General Surgery) Theodore Demark, RN (Inactive) as Oncology Nurse Navigator Cammie Sickle, MD as Consulting Physician (Oncology) Hollice Espy, MD as Consulting Physician (Urology)  Indicate any recent Medical Services you may have received from other than [facility] providers in the past year (date may be approximate).     Assessment:   This is a routine wellness examination for Cheryl [name].  Hearing/Vision screen No results found.  Dietary issues and exercise activities discussed:     Goals Addressed   None   Depression Screen    04/30/2021    9:28 AM 10/04/2020    9:39 AM 08/15/2020    9:46 AM 04/20/2020   10:38 AM 09/30/2019  9:41 AM 09/20/2019   11:41 AM 06/17/2019   11:05 AM  PHQ 2/9 Scores  PHQ - 2 Score 0 2 0 0 0 0 0  PHQ- 9 Score 0 4    0 0    Fall Risk    04/30/2021    9:28 AM 10/04/2020    9:44 AM 08/15/2020    9:46 AM 04/20/2020   10:38 AM 09/30/2019    9:46 AM  Fall Risk   Falls in the past year? 0 0 0 0 0  Number falls in past yr: 0 0 0 0 0  Injury with Fall? 0 0 0 0 0  Risk for fall due to : No Fall Risks No Fall Risks   No Fall Risks  Follow up Falls prevention discussed Falls prevention discussed   Falls prevention discussed    FALL RISK PREVENTION PERTAINING TO THE HOME:  Any stairs in or around the home? Yes  If so, are there any without handrails? Yes  Home free of loose throw rugs in walkways, pet beds, electrical cords, etc? Yes  Adequate lighting in your home to reduce risk of falls? Yes   ASSISTIVE DEVICES UTILIZED TO PREVENT FALLS:  Life alert? No  Use of a cane, walker or w/c? No  Grab bars in the bathroom? Yes  Shower chair or bench in shower? Yes  Elevated toilet seat or a handicapped toilet? Yes   TIMED UP AND GO:  Was the test performed? No .  Length of time to ambulate 10 feet: n/a sec.     Cognitive Function:        Immunizations Immunization History  Administered Date(s) Administered   Tdap 04/21/2019    TDAP  status: Up to date  Flu Vaccine status: Up to date  Pneumococcal vaccine status: Declined,  Education has been provided regarding the importance of this vaccine but patient still declined. Advised may receive this vaccine at local pharmacy or Health Dept. Aware to provide a copy of the vaccination record if obtained from local pharmacy or Health Dept. Verbalized acceptance and understanding.   Covid-19 vaccine status: Declined, Education has been provided regarding the importance of this vaccine but patient still declined. Advised may receive this vaccine at local pharmacy or Health Dept.or vaccine clinic. Aware to provide a copy of the vaccination record if obtained from local pharmacy or Health Dept. Verbalized acceptance and understanding.  Qualifies for Shingles Vaccine? Yes   Zostavax completed No   Shingrix Completed?: No.    Education has been provided regarding the importance of this vaccine. Patient has been advised to call insurance company to determine out of pocket expense if they have not yet received this vaccine. Advised may also receive vaccine at local pharmacy or Health Dept. Verbalized acceptance and understanding.  Screening Tests Health Maintenance  Topic Date Due   Zoster Vaccines- Shingrix (1 of 2) Never done   Pneumonia Vaccine 77+ Years old (1 - PCV) Never done   MAMMOGRAM  09/19/2022   DEXA SCAN  03/21/2023   Fecal DNA (Cologuard)  06/16/2023   TETANUS/TDAP  04/20/2029   Hepatitis C Screening  Completed   HPV VACCINES  Aged Out   INFLUENZA VACCINE  Discontinued   COVID-19 Vaccine  Discontinued    Health Maintenance  Health Maintenance Due  Topic Date Due   Zoster Vaccines- Shingrix (1 of 2) Never done   Pneumonia Vaccine 38+ Years old (1 - PCV) Never done    Colorectal cancer screening: Type  of screening: Cologuard. Completed 06/15/2020. Repeat every 3 years  Mammogram status: Completed 09/18/2021. Repeat every year  Bone Density status: Completed  03/10/2021. Results reflect: Bone density results: OSTEOPENIA. Repeat every 2 years.  Lung Cancer Screening: (Low Dose CT Chest recommended if Age 74-80 years, 30 pack-year currently smoking OR have quit w/in 15years.) does not qualify.   Lung Cancer Screening Referral: N/A  Additional Screening:  Hepatitis C Screening: does not qualify; Completed 04/30/2021  Vision Screening: Recommended annual ophthalmology exams for early detection of glaucoma and other disorders of the eye. Is the patient up to date with their annual eye exam?  No  Who is the provider or what is the name of the office in which the patient attends annual eye exams?  If pt is not established with a provider, would they like to be referred to a provider to establish care? Yes .   Dental Screening: Recommended annual dental exams for proper oral hygiene  Community Resource Referral / Chronic Care Management: CRR required this visit?  No   CCM required this visit?  No      Plan:     I have personally reviewed and noted the following in the patient's chart:   Medical and social history Use of alcohol, tobacco or illicit drugs  Current medications and supplements including opioid prescriptions.  Functional ability and status Nutritional status Physical activity Advanced directives List of other physicians Hospitalizations, surgeries, and ER visits in previous 12 months Vitals Screenings to include cognitive, depression, and falls Referrals and appointments  In addition, I have reviewed and discussed with patient certain preventive protocols, quality metrics, and best practice recommendations. A written personalized care plan for preventive services as well as general preventive health recommendations were provided to patient.     Royal Hawthorn, [place]   01/09/2022   Nurse Notes: Non Face to face minutes spent  Ms. Giddings , Thank you for taking time to come for your Medicare Wellness Visit. I appreciate  your ongoing commitment to your health goals. Please review the following plan we discussed and let me know if I can assist you in the future.   These are the goals we discussed:  Goals      Patient Stated     Pt states she would like to travel and sight see more over the next year.         This is a list of the screening recommended for you and due dates:  Health Maintenance  Topic Date Due   Zoster (Shingles) Vaccine (1 of 2) Never done   Pneumonia Vaccine (1 - PCV) Never done   Mammogram  09/19/2022   DEXA scan (bone density measurement)  03/21/2023   Cologuard (Stool DNA test)  06/16/2023   Tetanus Vaccine  04/20/2029   Hepatitis C Screening: USPSTF Recommendation to screen - Ages 18-79 yo.  Completed   HPV Vaccine  Aged Out   Flu Shot  Discontinued   COVID-19 Vaccine  Discontinued

## 2022-01-10 ENCOUNTER — Ambulatory Visit: Payer: Medicare Other | Admitting: Family Medicine

## 2022-01-20 ENCOUNTER — Other Ambulatory Visit: Payer: Self-pay | Admitting: Internal Medicine

## 2022-01-23 ENCOUNTER — Ambulatory Visit: Payer: Medicare Other | Attending: Radiation Oncology | Admitting: Radiation Oncology

## 2022-02-10 NOTE — Patient Instructions (Signed)
Preventive Care 65 Years and Older, Female Preventive care refers to lifestyle choices and visits with your health care provider that can promote health and wellness. Preventive care visits are also called wellness exams. What can I expect for my preventive care visit? Counseling Your health care provider may ask you questions about your: Medical history, including: Past medical problems. Family medical history. Pregnancy and menstrual history. History of falls. Current health, including: Memory and ability to understand (cognition). Emotional well-being. Home life and relationship well-being. Sexual activity and sexual health. Lifestyle, including: Alcohol, nicotine or tobacco, and drug use. Access to firearms. Diet, exercise, and sleep habits. Work and work environment. Sunscreen use. Safety issues such as seatbelt and bike helmet use. Physical exam Your health care provider will check your: Height and weight. These may be used to calculate your BMI (body mass index). BMI is a measurement that tells if you are at a healthy weight. Waist circumference. This measures the distance around your waistline. This measurement also tells if you are at a healthy weight and may help predict your risk of certain diseases, such as type 2 diabetes and high blood pressure. Heart rate and blood pressure. Body temperature. Skin for abnormal spots. What immunizations do I need?  Vaccines are usually given at various ages, according to a schedule. Your health care provider will recommend vaccines for you based on your age, medical history, and lifestyle or other factors, such as travel or where you work. What tests do I need? Screening Your health care provider may recommend screening tests for certain conditions. This may include: Lipid and cholesterol levels. Hepatitis C test. Hepatitis B test. HIV (human immunodeficiency virus) test. STI (sexually transmitted infection) testing, if you are at  risk. Lung cancer screening. Colorectal cancer screening. Diabetes screening. This is done by checking your blood sugar (glucose) after you have not eaten for a while (fasting). Mammogram. Talk with your health care provider about how often you should have regular mammograms. BRCA-related cancer screening. This may be done if you have a family history of breast, ovarian, tubal, or peritoneal cancers. Bone density scan. This is done to screen for osteoporosis. Talk with your health care provider about your test results, treatment options, and if necessary, the need for more tests. Follow these instructions at home: Eating and drinking  Eat a diet that includes fresh fruits and vegetables, whole grains, lean protein, and low-fat dairy products. Limit your intake of foods with high amounts of sugar, saturated fats, and salt. Take vitamin and mineral supplements as recommended by your health care provider. Do not drink alcohol if your health care provider tells you not to drink. If you drink alcohol: Limit how much you have to 0-1 drink a day. Know how much alcohol is in your drink. In the [place] one drink equals one 12 oz bottle of beer (355 mL), one 5 oz glass of wine (148 mL), or one 1 oz glass of hard liquor (44 mL). Lifestyle Brush your teeth every morning and night with fluoride toothpaste. Floss one time each day. Exercise for at least 30 minutes 5 or more days each week. Do not use any products that contain nicotine or tobacco. These products include cigarettes, chewing tobacco, and vaping devices, such as e-cigarettes. If you need help quitting, ask your health care provider. Do not use drugs. If you are sexually active, practice safe sex. Use a condom or other form of protection in order to prevent STIs. Take aspirin only as told by   your health care provider. Make sure that you understand how much to take and what form to take. Work with your health care provider to find out whether it  is safe and beneficial for you to take aspirin daily. Ask your health care provider if you need to take a cholesterol-lowering medicine (statin). Find healthy ways to manage stress, such as: Meditation, yoga, or listening to music. Journaling. Talking to a trusted person. Spending time with friends and family. Minimize exposure to UV radiation to reduce your risk of skin cancer. Safety Always wear your seat belt while driving or riding in a vehicle. Do not drive: If you have been drinking alcohol. Do not ride with someone who has been drinking. When you are tired or distracted. While texting. If you have been using any mind-altering substances or drugs. Wear a helmet and other protective equipment during sports activities. If you have firearms in your house, make sure you follow all gun safety procedures. What's next? Visit your health care provider once a year for an annual wellness visit. Ask your health care provider how often you should have your eyes and teeth checked. Stay up to date on all vaccines. This information is not intended to replace advice given to you by your health care provider. Make sure you discuss any questions you have with your health care provider. Document Revised: 11/14/2020 Document Reviewed: 11/14/2020 Elsevier Patient Education  2023 [employer]  

## 2022-02-11 ENCOUNTER — Encounter: Payer: Self-pay | Admitting: Family Medicine

## 2022-02-11 ENCOUNTER — Ambulatory Visit (INDEPENDENT_AMBULATORY_CARE_PROVIDER_SITE_OTHER): Payer: Medicare Other | Admitting: Family Medicine

## 2022-02-11 VITALS — BP 132/70 | HR 87 | Temp 98.0°F | Resp 16 | Ht 64.5 in | Wt 141.8 lb

## 2022-02-11 DIAGNOSIS — Z0000 Encounter for general adult medical examination without abnormal findings: Secondary | ICD-10-CM | POA: Diagnosis not present

## 2022-02-11 DIAGNOSIS — Z803 Family history of malignant neoplasm of breast: Secondary | ICD-10-CM

## 2022-02-11 DIAGNOSIS — Z23 Encounter for immunization: Secondary | ICD-10-CM

## 2022-02-11 DIAGNOSIS — Z853 Personal history of malignant neoplasm of breast: Secondary | ICD-10-CM | POA: Insufficient documentation

## 2022-02-11 DIAGNOSIS — L989 Disorder of the skin and subcutaneous tissue, unspecified: Secondary | ICD-10-CM

## 2022-02-11 DIAGNOSIS — L299 Pruritus, unspecified: Secondary | ICD-10-CM

## 2022-02-11 DIAGNOSIS — R5383 Other fatigue: Secondary | ICD-10-CM

## 2022-02-11 DIAGNOSIS — E559 Vitamin D deficiency, unspecified: Secondary | ICD-10-CM

## 2022-02-11 DIAGNOSIS — M810 Age-related osteoporosis without current pathological fracture: Secondary | ICD-10-CM

## 2022-02-11 DIAGNOSIS — R0609 Other forms of dyspnea: Secondary | ICD-10-CM

## 2022-02-11 DIAGNOSIS — E785 Hyperlipidemia, unspecified: Secondary | ICD-10-CM

## 2022-02-11 DIAGNOSIS — Z7189 Other specified counseling: Secondary | ICD-10-CM

## 2022-02-11 NOTE — Progress Notes (Signed)
Patient: Cheryl [name], Female    DOB: [birth year], 71 y.o.   MRN: [id number] Delsa Grana, PA-C Visit Date: 02/11/2022  Today's Provider: Delsa Grana, PA-C   Chief Complaint  Patient presents with   Annual Exam   Subjective:   Annual physical exam:  Cheryl [name] is a 71 y.o. female who presents today for complete physical exam:  Exercise/Activity: 1 x a week walking Diet/nutrition:  no particular diet Sleep:  no concerns  SDOH Screenings   Food Insecurity: No Food Insecurity (02/11/2022)  Housing: Low Risk  (02/11/2022)  Transportation Needs: No Transportation Needs (02/11/2022)  Utilities: Not At Risk (02/11/2022)  Alcohol Screen: Low Risk  (02/11/2022)  Depression (PHQ2-9): Low Risk  (02/11/2022)  Financial Resource Strain: Low Risk  (02/11/2022)  Physical Activity: Insufficiently Active (02/11/2022)  Social Connections: Moderately Integrated (02/11/2022)  Recent Concern: Social Connections - Moderately Isolated (01/09/2022)  Stress: No Stress Concern Present (02/11/2022)  Tobacco Use: Low Risk  (02/11/2022)     USPSTF grade A and B recommendations - reviewed and addressed today  Depression:  Phq 9 completed today by patient, was reviewed by me with patient in the room PHQ score is neg, pt feels good    02/11/2022   10:41 AM 01/09/2022   11:06 AM 04/30/2021    9:28 AM 10/04/2020    9:39 AM  PHQ 2/9 Scores  PHQ - 2 Score 0 2 0 2  PHQ- 9 Score 0 3 0 4      02/11/2022   10:41 AM 01/09/2022   11:06 AM 04/30/2021    9:28 AM 10/04/2020    9:39 AM 08/15/2020    9:46 AM  Depression screen PHQ 2/9  Decreased Interest 0 1 0 1 0  Down, Depressed, Hopeless 0 1 0 1 0  PHQ - 2 Score 0 2 0 2 0  Altered sleeping 0 0 0 1   Tired, decreased energy 0 1 0 1   Change in appetite 0 0 0 0   Feeling bad or failure about yourself  0 0 0 0   Trouble concentrating 0 0 0 0   Moving slowly or fidgety/restless 0 0 0 0   Suicidal thoughts 0 0 0 0   PHQ-9 Score 0 3 0 4   Difficult  doing work/chores Not difficult at all Not difficult at all Not difficult at all Not difficult at all     Alcohol screening: [place] Office Visit from 02/11/2022 in [facility]  AUDIT-C Score 0       Immunizations and Health Maintenance: Health Maintenance  Topic Date Due   Zoster Vaccines- Shingrix (1 of 2) 04/11/2022 (Originally 09/23/1969)   Pneumonia Vaccine 4+ Years old (1 - PCV) 01/10/2023 (Originally 09/22/2017)   MAMMOGRAM  09/19/2022   DEXA SCAN  03/21/2023   Fecal DNA (Cologuard)  06/16/2023   TETANUS/TDAP  04/20/2029   Hepatitis C Screening  Completed   HPV VACCINES  Aged Out   INFLUENZA VACCINE  Discontinued   COVID-19 Vaccine  Discontinued     Hep C Screening: done  STD testing and prevention (HIV/chl/gon/syphilis):  see above, no additional testing desired by pt today  Intimate partner violence:  lives with daughter and granddaughter - safe at home, no concerns  Sexual History/Pain during Intercourse: Single  Menstrual History/LMP/Abnormal Bleeding: none, denies No LMP recorded. Patient is postmenopausal.  Incontinence Symptoms: no sx  Breast cancer: per oncology - did mammogram April, f/up next year with  Dr. Jacinto Reap Last Mammogram: *see HM list above BRCA gene screening: none  Cervical cancer screening: aged out Pt denies family hx of cancers - breast, ovarian, uterine, colon:     Osteoporosis:   Discussion on osteoporosis per age, including high calcium and vitamin D supplementation, weight bearing exercises Pt is NOT supplementing with daily calcium/Vit D. - SHOULD BE - reviewed with pt, on fosamax, dexa due next year Skin cancer:  Hx of skin CA -  NO Back skin itching no improvement with cortisone and other OTC treatment Discussed atypical lesions   Colorectal cancer:   Did cologuard, due in 2025, discussed colonoscopy screening Discussed concerning signs and sx of CRC, pt denies change in bowels  Lung cancer:   Low Dose CT  Chest recommended if Age 68-80 years, 20 pack-year currently smoking OR have quit w/in 15years. Patient does not qualify.    Social History   Tobacco Use   Smoking status: Never   Smokeless tobacco: Never  Vaping Use   Vaping Use: Never used  Substance Use Topics   Alcohol use: Never   Drug use: Never     [place] Office Visit from 02/11/2022 in [facility]  AUDIT-C Score 0       Family History  Problem Relation Age of Onset   Breast cancer Mother 24   Breast cancer Maternal Aunt 13   Breast cancer Maternal Aunt 70   Throat cancer Father    Stroke Brother      Blood pressure/Hypertension: BP Readings from Last 3 Encounters:  02/11/22 132/70  07/25/21 137/75  04/30/21 140/78    Weight/Obesity: Wt Readings from Last 3 Encounters:  02/11/22 141 lb 12.8 oz (64.3 kg)  07/25/21 140 lb (63.5 kg)  04/30/21 141 lb 6.4 oz (64.1 kg)   BMI Readings from Last 3 Encounters:  02/11/22 23.96 kg/m  07/25/21 21.93 kg/m  04/30/21 22.15 kg/m     Lipids:  Lab Results  Component Value Date   CHOL 163 04/30/2021   CHOL 184 04/20/2020   CHOL 242 (H) 09/20/2019   Lab Results  Component Value Date   HDL 56 04/30/2021   HDL 61 04/20/2020   HDL 53 09/20/2019   Lab Results  Component Value Date   LDLCALC 90 04/30/2021   LDLCALC 107 (H) 04/20/2020   LDLCALC 167 (H) 09/20/2019   Lab Results  Component Value Date   TRIG 83 04/30/2021   TRIG 75 04/20/2020   TRIG 106 09/20/2019   Lab Results  Component Value Date   CHOLHDL 2.9 04/30/2021   CHOLHDL 3.0 04/20/2020   CHOLHDL 4.6 09/20/2019   No results found for: "LDLDIRECT" Based on the results of lipid panel his/her cardiovascular risk factor ( using [place] )  in the next 10 years is: The 10-year ASCVD risk score (Arnett DK, et al., 2019) is: 9.6%   Values used to calculate the score:     Age: 75 years     Sex: Female     Is Non-Hispanic African American: Yes     Diabetic: No      Tobacco smoker: No     Systolic Blood Pressure: 791 mmHg     Is BP treated: No     HDL Cholesterol: 56 mg/dL     Total Cholesterol: 163 mg/dL  Glucose:  Glucose, Bld  Date Value Ref Range Status  04/30/2021 91 65 - 99 mg/dL Final    Comment:    .  Fasting reference interval .   09/21/2020 85 70 - 99 mg/dL Final    Comment:    Glucose reference range applies only to samples taken after fasting for at least 8 hours.  04/20/2020 85 65 - 99 mg/dL Final    Comment:    .            Fasting reference interval .     Advanced Care Planning:  A voluntary discussion about advance care planning including the explanation and discussion of advance directives.   Discussed health care proxy and Living will, and the patient was able to identify a health care proxy as daughter Vanessa Barbara Orner.   Patient does not have a living will at present time.   Social History       Social History   Socioeconomic History   Marital status: Single    Spouse name: Not on file   Number of children: 1   Years of education: 12   Highest education level: Not on file  Occupational History   Occupation: retired  Tobacco Use   Smoking status: Never   Smokeless tobacco: Never  Vaping Use   Vaping Use: Never used  Substance and Sexual Activity   Alcohol use: Never   Drug use: Never   Sexual activity: Yes    Birth control/protection: Post-menopausal, None    Comment: One female partner off and on  Other Topics Concern   Not on file  Social History Narrative   Pt lives with daughter and granddaughter in [place]. Worked as CMA at [employer]. Never smoked; no alcohol.    Social Determinants of Health   Financial Resource Strain: Low Risk  (02/11/2022)   Overall Financial Resource Strain (CARDIA)    Difficulty of Paying Living Expenses: Not hard at all  Food Insecurity: No Food Insecurity (02/11/2022)   Hunger Vital Sign    Worried About Running Out of Food in the Last Year: Never true     Ran Out of Food in the Last Year: Never true  Transportation Needs: No Transportation Needs (02/11/2022)   PRAPARE - Hydrologist (Medical): No    Lack of Transportation (Non-Medical): No  Physical Activity: Insufficiently Active (02/11/2022)   Exercise Vital Sign    Days of Exercise per Week: 5 days    Minutes of Exercise per Session: 20 min  Stress: No Stress Concern Present (02/11/2022)   [place]    Feeling of Stress : Only a little  Social Connections: Moderately Integrated (02/11/2022)   Social Connection and Isolation Panel [NHANES]    Frequency of Communication with Friends and Family: More than three times a week    Frequency of Social Gatherings with Friends and Family: More than three times a week    Attends Religious Services: More than 4 times per year    Active Member of [employer] or Organizations: Yes    Attends Music therapist: More than 4 times per year    Marital Status: Never married  Recent Concern: Social Connections - Moderately Isolated (01/09/2022)   Social Connection and Isolation Panel [NHANES]    Frequency of Communication with Friends and Family: More than three times a week    Frequency of Social Gatherings with Friends and Family: More than three times a week    Attends Religious Services: More than 4 times per year    Active Member of Clubs or Organizations: No  Attends Archivist Meetings: Never    Marital Status: Widowed    Family History        Family History  Problem Relation Age of Onset   Breast cancer Mother 15   Breast cancer Maternal Aunt 9   Breast cancer Maternal Aunt 61   Throat cancer Father    Stroke Brother     Patient Active Problem List   Diagnosis Date Noted   Malignant neoplasm of upper-outer quadrant of right breast in female, estrogen receptor positive ([place]) 08/15/2020   Hyperlipidemia 06/21/2019   Vitamin D  deficiency 06/21/2019   Hematuria 06/21/2019   Family history of breast cancer in first degree relative 06/21/2019   Osteoporosis without current pathological fracture 06/17/2019   Left shoulder pain 06/17/2019    Past Surgical History:  Procedure Laterality Date   BREAST BIOPSY Right 08/08/2020   U/S bx- GRADE II INVASIVE MAMMARY CARCINOMA   BREAST LUMPECTOMY Right 09/25/2020   Invasive lobular carcinoma   BREAST LUMPECTOMY WITH RADIOACTIVE SEED AND SENTINEL LYMPH NODE BIOPSY Right 09/25/2020   Procedure: RIGHT BREAST LUMPECTOMY WITH RADIOACTIVE SEED AND RIGHT SENTINEL LYMPH NODE BIOPSY;  Surgeon: Erroll Luna, MD;  Location: [facility];  Service: General;  Laterality: Right;     Current Outpatient Medications:    alendronate (FOSAMAX) 70 MG tablet, TAKE 1 TABLET BY MOUTH ONCE A WEEK. TAKE WITH A FULL GLASS OF WATER ON AN EMPTY STOMACH., Disp: 12 tablet, Rfl: 2   ibuprofen (ADVIL) 800 MG tablet, Take 1 tablet (800 mg total) by mouth every 8 (eight) hours as needed., Disp: 30 tablet, Rfl: 0   Multiple Vitamin (MULTIVITAMIN WITH MINERALS) TABS tablet, Take 1 tablet by mouth daily., Disp: , Rfl:    rosuvastatin (CRESTOR) 10 MG tablet, Take 1 tablet (10 mg total) by mouth daily., Disp: 90 tablet, Rfl: 3   anastrozole (ARIMIDEX) 1 MG tablet, TAKE 1 TABLET BY MOUTH EVERY DAY (Patient not taking: Reported on 02/11/2022), Disp: 90 tablet, Rfl: 1   oxyCODONE (OXY IR/ROXICODONE) 5 MG immediate release tablet, Take 1 tablet (5 mg total) by mouth every 6 (six) hours as needed for severe pain. (Patient not taking: Reported on 04/30/2021), Disp: 15 tablet, Rfl: 0   polyethylene glycol powder (GLYCOLAX/MIRALAX) 17 GM/SCOOP powder, Take 1/2 capful to 1 capful (8.5- 17g) g PO by dissolving in clear liquid drink once daily for constipation (Patient not taking: Reported on 02/11/2022), Disp: 578 g, Rfl: 1  No Known Allergies  Patient Care Team: Delsa Grana, PA-C as PCP - General (Family  Medicine) Rico Junker, RN as Registered Nurse Noreene Filbert, MD as Referring Physician (Radiation Oncology) Erroll Luna, MD as Consulting Physician (General Surgery) Theodore Demark, RN (Inactive) as Oncology Nurse Navigator Cammie Sickle, MD as Consulting Physician (Oncology) Hollice Espy, MD as Consulting Physician (Urology)   Chart Review: I personally reviewed active problem list, medication list, allergies, family history, social history, health maintenance, notes from last encounter, lab results, imaging with the patient/caregiver today.   Review of Systems  Constitutional: Negative.   HENT: Negative.    Eyes: Negative.   Respiratory: Negative.    Cardiovascular: Negative.   Gastrointestinal: Negative.   Endocrine: Negative.   Genitourinary: Negative.   Musculoskeletal: Negative.   Skin: Negative.   Allergic/Immunologic: Negative.   Neurological: Negative.   Hematological: Negative.   Psychiatric/Behavioral: Negative.    All other systems reviewed and are negative.         Objective:  Vitals:  Vitals:   02/11/22 1043  BP: 132/70  Pulse: 87  Resp: 16  Temp: 98 F (36.7 C)  TempSrc: Oral  SpO2: 100%  Weight: 141 lb 12.8 oz (64.3 kg)  Height: 5' 4.5" (1.638 m)    Body mass index is 23.96 kg/m.  Physical Exam Constitutional:      General: She is not in acute distress.    Appearance: Normal appearance. She is well-developed and normal weight. She is not ill-appearing, toxic-appearing or diaphoretic.  HENT:     Head: Normocephalic and atraumatic.     Right Ear: Tympanic membrane, ear canal and external ear normal. There is no impacted cerumen.     Left Ear: Tympanic membrane, ear canal and external ear normal. There is no impacted cerumen.     Nose: Nose normal. No congestion.     Mouth/Throat:     Mouth: Mucous membranes are moist.     Pharynx: Oropharynx is clear. Uvula midline. No oropharyngeal exudate.  Eyes:     General: Lids  are normal. No scleral icterus.       Right eye: No discharge.        Left eye: No discharge.     Conjunctiva/sclera: Conjunctivae normal.  Neck:     Trachea: Phonation normal. No tracheal deviation.  Cardiovascular:     Rate and Rhythm: Normal rate and regular rhythm.     Pulses: Normal pulses.          Radial pulses are 2+ on the right side and 2+ on the left side.       Posterior tibial pulses are 2+ on the right side and 2+ on the left side.     Heart sounds: Normal heart sounds. No murmur heard.    No friction rub. No gallop.  Pulmonary:     Effort: Pulmonary effort is normal. No respiratory distress.     Breath sounds: Normal breath sounds. No stridor. No wheezing, rhonchi or rales.  Chest:     Chest wall: No tenderness.  Abdominal:     General: Bowel sounds are normal. There is no distension.     Palpations: Abdomen is soft.     Tenderness: There is no abdominal tenderness. There is no guarding or rebound.  Musculoskeletal:        General: No deformity.     Cervical back: Normal range of motion and neck supple.     Right lower leg: No edema.     Left lower leg: No edema.  Lymphadenopathy:     Cervical: No cervical adenopathy.  Skin:    General: Skin is warm and dry.     Capillary Refill: Capillary refill takes less than 2 seconds.     Coloration: Skin is not pale.     Findings: No rash.     Comments: Pt concerned with itching to back, upper back just right of midline some scattered hyperpigmented macules 1-2 mm diameter mostly round, several in a 3x3 cm area- no raised lesions or rash, no excoriations  Neurological:     Mental Status: She is alert. Mental status is at baseline.     Motor: No abnormal muscle tone.     Gait: Gait normal.  Psychiatric:        Mood and Affect: Mood normal.        Speech: Speech normal.        Behavior: Behavior normal.       Fall Risk:    02/11/2022  10:40 AM 01/09/2022   11:05 AM 04/30/2021    9:28 AM 10/04/2020    9:44 AM  08/15/2020    9:46 AM  Fall Risk   Falls in the past year? 0 0 0 0 0  Number falls in past yr: 0  0 0 0  Injury with Fall? 0  0 0 0  Risk for fall due to : No Fall Risks No Fall Risks No Fall Risks No Fall Risks   Follow up Falls prevention discussed;Education provided Falls evaluation completed;Education provided;Falls prevention discussed Falls prevention discussed Falls prevention discussed     Functional Status Survey: Is the patient deaf or have difficulty hearing?: No Does the patient have difficulty seeing, even when wearing glasses/contacts?: No Does the patient have difficulty concentrating, remembering, or making decisions?: No Does the patient have difficulty walking or climbing stairs?: No Does the patient have difficulty dressing or bathing?: No Does the patient have difficulty doing errands alone such as visiting a doctor's office or shopping?: No   Assessment & Plan:    CPE completed today  USPSTF grade A and B recommendations reviewed with patient; age-appropriate recommendations, preventive care, screening tests, etc discussed and encouraged; healthy living encouraged; see AVS for patient education given to patient  Discussed importance of 150 minutes of physical activity weekly, AHA exercise recommendations given to pt in AVS/handout  Discussed importance of healthy diet:  eating lean meats and proteins, avoiding trans fats and saturated fats, avoid simple sugars and excessive carbs in diet, eat 6 servings of fruit/vegetables daily and drink plenty of water and avoid sweet beverages.    Recommended pt to do annual eye exam and routine dental exams/cleanings  Depression, alcohol, fall screening completed as documented above and per flowsheets  Advance Care planning information and packet discussed and offered today, encouraged pt to discuss with family members/spouse/partner/friends and complete Advanced directive packet and bring copy to office   Reviewed Health  Maintenance: Health Maintenance  Topic Date Due   Zoster Vaccines- Shingrix (1 of 2) 04/11/2022 (Originally 09/23/1969)   Pneumonia Vaccine 41+ Years old (1 - PCV) 01/10/2023 (Originally 09/22/2017)   MAMMOGRAM  09/19/2022   DEXA SCAN  03/21/2023   Fecal DNA (Cologuard)  06/16/2023   TETANUS/TDAP  04/20/2029   Hepatitis C Screening  Completed   HPV VACCINES  Aged Out   INFLUENZA VACCINE  Discontinued   COVID-19 Vaccine  Discontinued    Immunizations: Immunization History  Administered Date(s) Administered   Tdap 04/21/2019   Vaccines:  HPV: up to at age 92 , ask insurance if age between 81-45  Shingrix: 68-64 yo and ask insurance if covered when patient above 74 yo Pneumonia: indicated - reviewed with pt- educated and discussed with patient. Flu:  educated and discussed with patient. COVID:    Problem List Items Addressed This Visit       Musculoskeletal and Integument   Osteoporosis without current pathological fracture    With specialists, dexa due next year, not on mulitvitamin or any calcium or vit d supplement, reviewed daily supplement amounts        Other   Hyperlipidemia   Relevant Orders   Lipid panel   Vitamin D deficiency   Family history of breast cancer in first degree relative   History of breast cancer    Managed by Dr. Jacinto Reap, pt stopped meds, did complete bx and radiation Reviewed her prior pathology and explained and reviewed why she was given Arimidex - pt strongly encouraged to  f/up with oncology - they do want to see her after she called and told them she stopped meds       Other Visit Diagnoses     Annual physical exam    -  Primary   Relevant Orders   COMPLETE METABOLIC PANEL WITH GFR   CBC with Differential/Platelet   Lipid panel   Need for pneumococcal vaccination       declined   Need for shingles vaccine       encouraged to complete at the pharmacy    Other fatigue       DOE (dyspnea on exertion)       extreme fatigue after errands,  winded with a flight of stairs   Advanced care planning/counseling discussion                Laurell Roof 02/11/22 10:56 AM  [place]

## 2022-02-11 NOTE — Assessment & Plan Note (Signed)
With specialists, dexa due next year, not on mulitvitamin or any calcium or vit d supplement, reviewed daily supplement amounts

## 2022-02-11 NOTE — Assessment & Plan Note (Signed)
Managed by Dr. Jacinto Reap, pt stopped meds, did complete bx and radiation Reviewed her prior pathology and explained and reviewed why she was given Arimidex - pt strongly encouraged to f/up with oncology - they do want to see her after she called and told them she stopped meds

## 2022-02-12 ENCOUNTER — Other Ambulatory Visit: Payer: Self-pay | Admitting: Family Medicine

## 2022-02-12 DIAGNOSIS — E785 Hyperlipidemia, unspecified: Secondary | ICD-10-CM

## 2022-02-12 LAB — CBC WITH DIFFERENTIAL/PLATELET
Absolute Monocytes: 436 cells/uL (ref 200–950)
Basophils Absolute: 40 cells/uL (ref 0–200)
Basophils Relative: 1 %
Eosinophils Absolute: 48 cells/uL (ref 15–500)
Eosinophils Relative: 1.2 %
HCT: 38.2 % (ref 35.0–45.0)
Hemoglobin: 12.5 g/dL (ref 11.7–15.5)
Lymphs Abs: 1280 cells/uL (ref 850–3900)
MCH: 29.3 pg (ref 27.0–33.0)
MCHC: 32.7 g/dL (ref 32.0–36.0)
MCV: 89.7 fL (ref 80.0–100.0)
MPV: 9.2 fL (ref 7.5–12.5)
Monocytes Relative: 10.9 %
Neutro Abs: 2196 cells/uL (ref 1500–7800)
Neutrophils Relative %: 54.9 %
Platelets: 220 10*3/uL (ref 140–400)
RBC: 4.26 10*6/uL (ref 3.80–5.10)
RDW: 13.9 % (ref 11.0–15.0)
Total Lymphocyte: 32 %
WBC: 4 10*3/uL (ref 3.8–10.8)

## 2022-02-12 LAB — COMPLETE METABOLIC PANEL WITH GFR
AG Ratio: 1.2 (calc) (ref 1.0–2.5)
ALT: 11 U/L (ref 6–29)
AST: 17 U/L (ref 10–35)
Albumin: 4.2 g/dL (ref 3.6–5.1)
Alkaline phosphatase (APISO): 71 U/L (ref 37–153)
BUN/Creatinine Ratio: 18 (calc) (ref 6–22)
BUN: 20 mg/dL (ref 7–25)
CO2: 24 mmol/L (ref 20–32)
Calcium: 9.7 mg/dL (ref 8.6–10.4)
Chloride: 104 mmol/L (ref 98–110)
Creat: 1.11 mg/dL — ABNORMAL HIGH (ref 0.50–1.05)
Globulin: 3.4 g/dL (calc) (ref 1.9–3.7)
Glucose, Bld: 76 mg/dL (ref 65–99)
Potassium: 4.9 mmol/L (ref 3.5–5.3)
Sodium: 138 mmol/L (ref 135–146)
Total Bilirubin: 0.3 mg/dL (ref 0.2–1.2)
Total Protein: 7.6 g/dL (ref 6.1–8.1)
eGFR: 54 mL/min/{1.73_m2} — ABNORMAL LOW (ref >=60)

## 2022-02-12 LAB — LIPID PANEL
Cholesterol: 169 mg/dL (ref <200)
HDL: 66 mg/dL (ref >=50)
LDL Cholesterol (Calc): 86 mg/dL (calc)
Non-HDL Cholesterol (Calc): 103 mg/dL (calc) (ref <130)
Total CHOL/HDL Ratio: 2.6 (calc) (ref <5.0)
Triglycerides: 80 mg/dL (ref <150)

## 2022-02-12 MED ORDER — ROSUVASTATIN CALCIUM 10 MG PO TABS: 10.0000 mg | ORAL_TABLET | Freq: Every day | ORAL | 3 refills | Status: DC

## 2022-02-13 ENCOUNTER — Telehealth: Payer: Self-pay | Admitting: Family Medicine

## 2022-02-13 NOTE — Telephone Encounter (Signed)
Previous referral was placed in 09/2020. Would you need to see her, she recently has a physical.

## 2022-02-13 NOTE — Telephone Encounter (Unsigned)
Copied from [place] [id number]. Topic: Referral - Status >> Feb 13, 2022  9:36 AM Cyndi Bender wrote: Reason for CRM: Pt called for an update on the referral to dermatology. Pt stated she would like to be referred to the location on [facility] but of not she can go to a location the provider chooses.

## 2022-02-13 NOTE — Addendum Note (Signed)
Addended by: Delsa Grana on: 02/13/2022 05:54 PM   Modules accepted: Orders

## 2022-02-13 NOTE — Telephone Encounter (Signed)
Referral added to her cpe - she was worried about it but I didn't see anythign concerning - it was in note - I just added referral per her request - [facility] is on [place] - not sure how soon she would get a call, but derm is scheduled out pretty far (months)

## 2022-02-14 NOTE — Telephone Encounter (Signed)
Pt has been notified.

## 2022-03-25 ENCOUNTER — Telehealth: Payer: Self-pay | Admitting: Family Medicine

## 2022-03-25 NOTE — Telephone Encounter (Signed)
[facility] is booked out for months, any other location that she could try?

## 2022-03-25 NOTE — Telephone Encounter (Signed)
Per pt she just going to stick with this one since she has an appointment on December 2023. Just an FYI- Thanks for your help

## 2022-03-25 NOTE — Telephone Encounter (Signed)
Copied from [place] [id number]. Topic: Referral - Request for Referral >> Mar 25, 2022 10:56 AM Tiffany B wrote: Patient would like PCP nurse specifically to call her back, patient did not know nurse name. Caller states dermatologist can not see patient until January and would like PCP to place referral with another specialist who can see her now.

## 2022-04-01 ENCOUNTER — Ambulatory Visit: Payer: Medicare Other | Admitting: Urology

## 2022-04-01 DIAGNOSIS — R3129 Other microscopic hematuria: Secondary | ICD-10-CM

## 2022-04-01 NOTE — Progress Notes (Deleted)
04/01/2022 11:19 AM   Cheryl [name] August 04, 1950 [id number]  Referring provider: Delsa Grana, PA-C [address] [place],  [place] [address]  No chief complaint on file.   HPI:    PMH: Past Medical History:  Diagnosis Date   Anxiety    Breast cancer ([place]) 07/2020   right breast ILC   Hyperlipidemia    Osteoporosis     Surgical History: Past Surgical History:  Procedure Laterality Date   BREAST BIOPSY Right 08/08/2020   U/S bx- GRADE II INVASIVE MAMMARY CARCINOMA   BREAST LUMPECTOMY Right 09/25/2020   Invasive lobular carcinoma   BREAST LUMPECTOMY WITH RADIOACTIVE SEED AND SENTINEL LYMPH NODE BIOPSY Right 09/25/2020   Procedure: RIGHT BREAST LUMPECTOMY WITH RADIOACTIVE SEED AND RIGHT SENTINEL LYMPH NODE BIOPSY;  Surgeon: Erroll Luna, MD;  Location: [facility];  Service: General;  Laterality: Right;    Home Medications:  Allergies as of 04/01/2022   No Known Allergies      Medication List        Accurate as of April 01, 2022 11:19 AM. If you have any questions, ask your nurse or doctor.          alendronate 70 MG tablet Commonly known as: FOSAMAX TAKE 1 TABLET BY MOUTH ONCE A WEEK. TAKE WITH A FULL GLASS OF WATER ON AN EMPTY STOMACH.   anastrozole 1 MG tablet Commonly known as: ARIMIDEX TAKE 1 TABLET BY MOUTH EVERY DAY   ibuprofen 800 MG tablet Commonly known as: ADVIL Take 1 tablet (800 mg total) by mouth every 8 (eight) hours as needed.   multivitamin with minerals Tabs tablet Take 1 tablet by mouth daily.   rosuvastatin 10 MG tablet Commonly known as: CRESTOR Take 1 tablet (10 mg total) by mouth daily.        Allergies: No Known Allergies  Family History: Family History  Problem Relation Age of Onset   Breast cancer Mother 69   Breast cancer Maternal Aunt 51   Breast cancer Maternal Aunt 70   Throat cancer Father    Stroke Brother     Social History:  reports that she has never smoked. She has  never used smokeless tobacco. She reports that she does not drink alcohol and does not use drugs.   Physical Exam: There were no vitals taken for this visit.  Constitutional:  Alert and oriented, No acute distress. HEENT: [place] AT, moist mucus membranes.  Trachea midline, no masses. Cardiovascular: No clubbing, cyanosis, or edema. Respiratory: Normal respiratory effort, no increased work of breathing. GI: Abdomen is soft, nontender, nondistended, no abdominal masses GU: No CVA tenderness Skin: No rashes, bruises or suspicious lesions. Neurologic: Grossly intact, no focal deficits, moving all 4 extremities. Psychiatric: Normal mood and affect.  Laboratory Data: Lab Results  Component Value Date   WBC 4.0 02/11/2022   HGB 12.5 02/11/2022   HCT 38.2 02/11/2022   MCV 89.7 02/11/2022   PLT 220 02/11/2022    Lab Results  Component Value Date   CREATININE 1.11 (H) 02/11/2022    No results found for: "PSA"  No results found for: "TESTOSTERONE"  Lab Results  Component Value Date   HGBA1C 5.6 06/17/2019    Urinalysis    Component Value Date/Time   COLORURINE YELLOW 04/20/2020 1127   APPEARANCEUR Cloudy (A) 08/16/2020 1135   LABSPEC 1.020 04/20/2020 1127   PHURINE 5.5 04/20/2020 1127   GLUCOSEU Negative 08/16/2020 1135   HGBUR 2+ (A) 04/20/2020 1127  BILIRUBINUR Negative 08/16/2020 [place] 04/20/2020 1127   PROTEINUR Negative 08/16/2020 [place] 04/20/2020 1127   UROBILINOGEN 0.2 06/17/2019 1251   NITRITE Negative 08/16/2020 1135   NITRITE NEGATIVE 04/20/2020 1127   LEUKOCYTESUR 1+ (A) 08/16/2020 1135   LEUKOCYTESUR 3+ (A) 04/20/2020 1127    Lab Results  Component Value Date   LABMICR See below: 08/16/2020   WBCUA >30 (A) 08/16/2020   LABEPIT >10 (A) 08/16/2020   BACTERIA Few 08/16/2020    Pertinent Imaging: *** No results found for this or any previous visit.  No results found for this or any previous visit.  No results  found for this or any previous visit.  No results found for this or any previous visit.  No results found for this or any previous visit.  No valid procedures specified. Results for orders placed during the hospital encounter of 08/10/19  CT HEMATURIA WORKUP  Narrative CLINICAL DATA:  Microscopic hematuria.  EXAM: CT ABDOMEN AND PELVIS WITHOUT AND WITH CONTRAST  TECHNIQUE: Multidetector CT imaging of the abdomen and pelvis was performed following the standard protocol before and following the bolus administration of intravenous contrast.  COMPARISON:  None.  FINDINGS: Lower chest: Unremarkable.  Hepatobiliary: No suspicious focal abnormality within the liver parenchyma. Small area of low attenuation in the anterior liver, adjacent to the falciform ligament, is in a characteristic location for focal fatty deposition. Gallbladder decompressed. No intrahepatic or extrahepatic biliary dilation.  Pancreas: No focal mass lesion. No dilatation of the main duct. No intraparenchymal cyst. No peripancreatic edema.  Spleen: No splenomegaly. No focal mass lesion.  Adrenals/Urinary Tract: No adrenal nodule or mass.  Precontrast imaging shows no stones in either kidney or ureter. No bladder stones.  Imaging after IV contrast administration shows no suspicious enhancing abnormality in either kidney. No substantial renal cyst identified in either kidney.  Imaging after IV contrast administration shows no wall thickening or soft tissue filling defect in either intrarenal collecting system or renal pelvis. Both ureters are well opacified and unremarkable. No focal bladder wall abnormality evident.  Stomach/Bowel: Stomach is unremarkable. No gastric wall thickening. No evidence of outlet obstruction. Duodenum is normally positioned as is the ligament of Treitz. No small bowel wall thickening. No small bowel dilatation. The terminal ileum is normal. The appendix is not visualized,  but there is no edema or inflammation in the region of the cecum. No gross colonic mass. No colonic wall thickening.  Vascular/Lymphatic: There is abdominal aortic atherosclerosis without aneurysm. There is no gastrohepatic or hepatoduodenal ligament lymphadenopathy. No retroperitoneal or mesenteric lymphadenopathy. No pelvic sidewall lymphadenopathy.  Reproductive: Unremarkable  Other: No intraperitoneal free fluid.  Musculoskeletal: No worrisome lytic or sclerotic osseous abnormality.  IMPRESSION: No findings to explain the patient's history of hematuria. Specifically, no evidence for urinary stone disease or suspicious renal mass. No urothelial abnormality identified on the current study.  Otherwise unremarkable exam.   Electronically Signed By: Misty Stanley M.D. On: 08/10/2019 13:06  No results found for this or any previous visit.   Assessment & Plan:    1. Microscopic hematuria ***   No follow-ups on file.  Hollice Espy, MD  [facility] [address], [place], [place] [address] [phone]

## 2022-04-14 ENCOUNTER — Encounter: Payer: Self-pay | Admitting: Radiation Oncology

## 2022-04-14 ENCOUNTER — Ambulatory Visit
Admission: RE | Admit: 2022-04-14 | Discharge: 2022-04-14 | Disposition: A | Discharge status: Home / Self Care | Payer: Medicare Other | Source: Ambulatory Visit | Attending: Radiation Oncology | Admitting: Radiation Oncology

## 2022-04-14 VITALS — BP 120/80 | HR 70 | Temp 97.9°F | Resp 16 | Ht 67.0 in | Wt 144.3 lb

## 2022-04-14 DIAGNOSIS — Z170 Estrogen receptor positive status [ER+]: Secondary | ICD-10-CM | POA: Insufficient documentation

## 2022-04-14 DIAGNOSIS — C50411 Malignant neoplasm of upper-outer quadrant of right female breast: Secondary | ICD-10-CM

## 2022-04-14 NOTE — Progress Notes (Signed)
Radiation Oncology Follow up Note  Name: Cheryl [name]   Date:   04/14/2022 MRN:  [id number] DOB: [birth year]    This 71 y.o. female presents to the clinic today for 1 year follow-up status post whole breast radiation to her right breast for invasive lobular carcinoma stage II (T2 N0 M0).  REFERRING PROVIDER: Delsa Grana, PA-C  HPI: Patient is a 71 year old female now out 1 year having completed whole breast radiation to her right breast for stage II invasive lobular carcinoma.  Seen today in routine follow-up she is doing well.  Still has some tenderness in the right axilla which we expect from her surgery otherwise is without complaint.  She had a mammogram back in.  April which I have reviewed was BI-RADS 2 benign.  She is currently on Arimidex.  COMPLICATIONS OF TREATMENT: none  FOLLOW UP COMPLIANCE: keeps appointments   PHYSICAL EXAM:  BP 120/80 (BP Location: Left Arm, Patient Position: Sitting, Cuff Size: Normal)   Pulse 70   Temp 97.9 F (36.6 C) (Tympanic)   Resp 16   Ht '5\' 7"'$  (1.702 m) Comment: stated ht  Wt 144 lb 4.8 oz (65.5 kg)   BMI 22.60 kg/m  Lungs are clear to A&P cardiac examination essentially unremarkable with regular rate and rhythm. No dominant mass or nodularity is noted in either breast in 2 positions examined. Incision is well-healed. No axillary or supraclavicular adenopathy is appreciated. Cosmetic result is excellent.  Well-developed well-nourished patient in NAD. HEENT reveals PERLA, EOMI, discs not visualized.  Oral cavity is clear. No oral mucosal lesions are identified. Neck is clear without evidence of cervical or supraclavicular adenopathy. Lungs are clear to A&P. Cardiac examination is essentially unremarkable with regular rate and rhythm without murmur rub or thrill. Abdomen is benign with no organomegaly or masses noted. Motor sensory and DTR levels are equal and symmetric in the upper and lower extremities. Cranial nerves II through XII are grossly  intact. Proprioception is intact. No peripheral adenopathy or edema is identified. No motor or sensory levels are noted. Crude visual fields are within normal range.  RADIOLOGY RESULTS: Mammogram reviewed compatible with above-stated findings  PLAN: Present time patient is doing well 1 year out with no evidence of disease.  And pleased with her overall progress.  I have asked to see her back in 1 year for follow-up.  She already has scheduled mammograms.  Patient knows to call with any concerns.  I would like to take this opportunity to thank you for allowing me to participate in the care of your patient.Noreene Filbert, MD

## 2022-04-15 ENCOUNTER — Ambulatory Visit (INDEPENDENT_AMBULATORY_CARE_PROVIDER_SITE_OTHER): Payer: Medicare Other | Admitting: Urology

## 2022-04-15 VITALS — BP 144/85 | HR 69 | Ht 67.0 in | Wt 144.0 lb

## 2022-04-15 DIAGNOSIS — R3129 Other microscopic hematuria: Secondary | ICD-10-CM

## 2022-04-15 DIAGNOSIS — N1831 Chronic kidney disease, stage 3a: Secondary | ICD-10-CM

## 2022-04-15 LAB — URINALYSIS, COMPLETE
Bilirubin, UA: NEGATIVE
Glucose, UA: NEGATIVE
Ketones, UA: NEGATIVE
Nitrite, UA: NEGATIVE
Protein,UA: NEGATIVE
Specific Gravity, UA: 1.01 (ref 1.005–1.030)
Urobilinogen, Ur: 0.2 mg/dL (ref 0.2–1.0)
pH, UA: 7 (ref 5.0–7.5)

## 2022-04-15 LAB — MICROSCOPIC EXAMINATION

## 2022-04-15 NOTE — Progress Notes (Signed)
04/15/2022 11:48 AM   Cheryl [name] Feb 02, 1951 [id number]  Referring provider: Delsa Grana, PA-C [address] [place],  [place] [address]  Chief Complaint  Patient presents with   Hematuria    HPI: 71 year old female who presents today for reevaluation of history of microscopic hematuria.  She underwent a complete negative CT urogram/cystoscopy in 2021.  She was seen last year at which time she was asymptomatic but her urinalysis was somewhat contaminated appearing with persistent microscopic blood.  Urinalysis today shows 11-30 white blood cells, 3-10 red blood cells, nitrate negative with few bacteria.  Patient is asymptomatic, denies any hematuria, urgency, frequency, or any other UTI type symptoms.  She does mention that on her most recent set of labs, her creatinine had risen to 1.1, previously normal on that her PCP is keeping an eye on this.  She denies any flank pain.  Since her last visit, she has been diagnosed and treated for breast cancer.  She is doing well from this regard.  She recently completed radiation.  Never smoker, no environmental exposures.  PMH: Past Medical History:  Diagnosis Date   Anxiety    Breast cancer ([place]) 07/2020   right breast ILC   Hyperlipidemia    Osteoporosis     Surgical History: Past Surgical History:  Procedure Laterality Date   BREAST BIOPSY Right 08/08/2020   U/S bx- GRADE II INVASIVE MAMMARY CARCINOMA   BREAST LUMPECTOMY Right 09/25/2020   Invasive lobular carcinoma   BREAST LUMPECTOMY WITH RADIOACTIVE SEED AND SENTINEL LYMPH NODE BIOPSY Right 09/25/2020   Procedure: RIGHT BREAST LUMPECTOMY WITH RADIOACTIVE SEED AND RIGHT SENTINEL LYMPH NODE BIOPSY;  Surgeon: Erroll Luna, MD;  Location: [facility];  Service: General;  Laterality: Right;    Home Medications:  Allergies as of 04/15/2022   No Known Allergies      Medication List        Accurate as of April 15, 2022 11:48  AM. If you have any questions, ask your nurse or doctor.          alendronate 70 MG tablet Commonly known as: FOSAMAX TAKE 1 TABLET BY MOUTH ONCE A WEEK. TAKE WITH A FULL GLASS OF WATER ON AN EMPTY STOMACH.   anastrozole 1 MG tablet Commonly known as: ARIMIDEX TAKE 1 TABLET BY MOUTH EVERY DAY   ibuprofen 800 MG tablet Commonly known as: ADVIL Take 1 tablet (800 mg total) by mouth every 8 (eight) hours as needed.   multivitamin with minerals Tabs tablet Take 1 tablet by mouth daily.   rosuvastatin 10 MG tablet Commonly known as: CRESTOR Take 1 tablet (10 mg total) by mouth daily.        Allergies: No Known Allergies  Family History: Family History  Problem Relation Age of Onset   Breast cancer Mother 90   Breast cancer Maternal Aunt 36   Breast cancer Maternal Aunt 70   Throat cancer Father    Stroke Brother     Social History:  reports that she has never smoked. She has never used smokeless tobacco. She reports that she does not drink alcohol and does not use drugs.   Physical Exam: BP (!) 144/85   Pulse 69   Ht '5\' 7"'$  (1.702 m)   Wt 144 lb (65.3 kg)   BMI 22.55 kg/m   Constitutional:  Alert and oriented, No acute distress. HEENT: [place] AT, moist mucus membranes.  Trachea midline, no masses. Cardiovascular: No clubbing, cyanosis, or edema. Respiratory:  Normal respiratory effort, no increased work of breathing. Neurologic: Grossly intact, no focal deficits, moving all 4 extremities. Psychiatric: Normal mood and affect.  Laboratory Data: Lab Results  Component Value Date   WBC 4.0 02/11/2022   HGB 12.5 02/11/2022   HCT 38.2 02/11/2022   MCV 89.7 02/11/2022   PLT 220 02/11/2022    Lab Results  Component Value Date   CREATININE 1.11 (H) 02/11/2022    Lab Results  Component Value Date   HGBA1C 5.6 06/17/2019     Assessment & Plan:    1. Microscopic hematuria Persistent microscopic hematuria  Urinalysis today appears more consistent with  contamination rather than infection although will send culture to rule this out.  We did discuss consideration of repeat cystoscopy with modified hematuria work-up given her persistent microscopic blood in her urine which AUA guidelines recommend consideration of every 2 to 3 years in this situation.  She is agreeable to plan, will proceed with renal ultrasound as well as repeat cystoscopy. - Urinalysis, Complete - CULTURE, URINE COMPREHENSIVE - Ultrasound renal complete; Future  2. Stage 3a chronic kidney disease ([place]) Eval with RUS as above   Return for RUS/ cysto.  Hollice Espy, MD  [facility] [address] [place], [place] [address] [phone]

## 2022-04-16 ENCOUNTER — Telehealth: Payer: Self-pay | Admitting: Family Medicine

## 2022-04-16 NOTE — Telephone Encounter (Signed)
Pt called back and was asking if per her Urologist provider ordering a [place], does she really need it. I advised pt to get it done due to specialty provider needing it. Pt verbalized understanding and also mentioned about her recent labs, if Kristeen Miss will be monitoring her kidney function. I advised pt per Kristeen Miss note she states she will continue to monitor and recheck labs on future appointments. At this point all she is doing is monitoring it.

## 2022-04-16 NOTE — Telephone Encounter (Signed)
Called pt no answer left vm tor return my call

## 2022-04-16 NOTE — Telephone Encounter (Signed)
Copied from [place] [phone] [id number]. Topic: General - Other >> Apr 16, 2022  1:51 PM Ja-Kwan M wrote: Reason for CRM: Pt requests that [place] assistant who does the weigh in return her call. Cb# [phone]

## 2022-04-17 ENCOUNTER — Ambulatory Visit: Payer: Medicare Other

## 2022-04-18 LAB — CULTURE, URINE COMPREHENSIVE

## 2022-06-12 ENCOUNTER — Ambulatory Visit: Payer: Self-pay

## 2022-06-12 NOTE — Telephone Encounter (Signed)
  Chief Complaint: COVID Symptoms: cough congestion, body aches, SOB at times, decreased appetite, fatigue and weakness Frequency: Monday Pertinent Negatives: Patient denies fever Disposition: '[]'$ ED /'[]'$ Urgent Care (no appt availability in office) / '[x]'$ Appointment(In office/virtual)/ '[]'$  [facility]/ '[]'$ Home Care/ '[]'$ Refused Recommended Disposition /'[]'$ [facility]/ '[]'$  Follow-up with PCP Additional Notes: pt states she felt bad Monday so took a home test and was positive. Pt has been taking Tylenol of body aches, requesting antiviral for sx. Offered VV for tomorrow with practice or can do virtual UC today but pt preferred to come in for OV. Advised she can wear a mask and may be asked to stay in car until time to go back, pt verbalized understanding. Scheduled OV for tomorrow at 1000 with Dr. Rosana Berger.   Summary: covid   Patient states that she tested positive for covid, aching, coughing, no appetite, shortness of breath comes and goes. Patient is wanting something called in, she is too weak to drive.  Please advise         Reason for Disposition  [1] Continuous (nonstop) coughing interferes with work or school AND [2] no improvement using cough treatment per Care Advice  Answer Assessment - Initial Assessment Questions 1. COVID-19 DIAGNOSIS: "How do you know that you have COVID?" (e.g., positive lab test or self-test, diagnosed by doctor or NP/PA, symptoms after exposure).     Home test Monday  3. ONSET: "When did the COVID-19 symptoms start?"      Monday 5. COUGH: "Do you have a cough?" If Yes, ask: "How bad is the cough?"       yes 6. FEVER: "Do you have a fever?" If Yes, ask: "What is your temperature, how was it measured, and when did it start?"     No  7. RESPIRATORY STATUS: "Describe your breathing?" (e.g., normal; shortness of breath, wheezing, unable to speak)      SOB at times  8. BETTER-SAME-WORSE: "Are you getting better, staying the same or getting worse  compared to yesterday?"  If getting worse, ask, "In what way?"     Worse  9. OTHER SYMPTOMS: "Do you have any other symptoms?"  (e.g., chills, fatigue, headache, loss of smell or taste, muscle pain, sore throat)     Cough congestion fatigue, decreased appetite, weakness  10. HIGH RISK DISEASE: "Do you have any chronic medical problems?" (e.g., asthma, heart or lung disease, weak immune system, obesity, etc.)       no 11. VACCINE: "Have you had the COVID-19 vaccine?" If Yes, ask: "Which one, how many shots, when did you get it?"       no  Protocols used: Coronavirus (COVID-19) Diagnosed or Suspected-A-AH

## 2022-06-13 ENCOUNTER — Ambulatory Visit: Payer: Medicare Other | Admitting: Internal Medicine

## 2022-06-13 ENCOUNTER — Telehealth: Payer: Medicare Other | Admitting: Family

## 2022-06-13 ENCOUNTER — Ambulatory Visit: Payer: Medicare Other | Admitting: Family

## 2022-07-15 IMAGING — MG DIGITAL SCREENING BILAT W/ TOMO W/ CAD
8 series · 8 of 24 positions shown · non-contrast
Comparison: Previous exam(s).

CLINICAL DATA: Screening.

EXAM:
DIGITAL SCREENING BILATERAL MAMMOGRAM WITH TOMO AND CAD

[R MLO synth-2D]
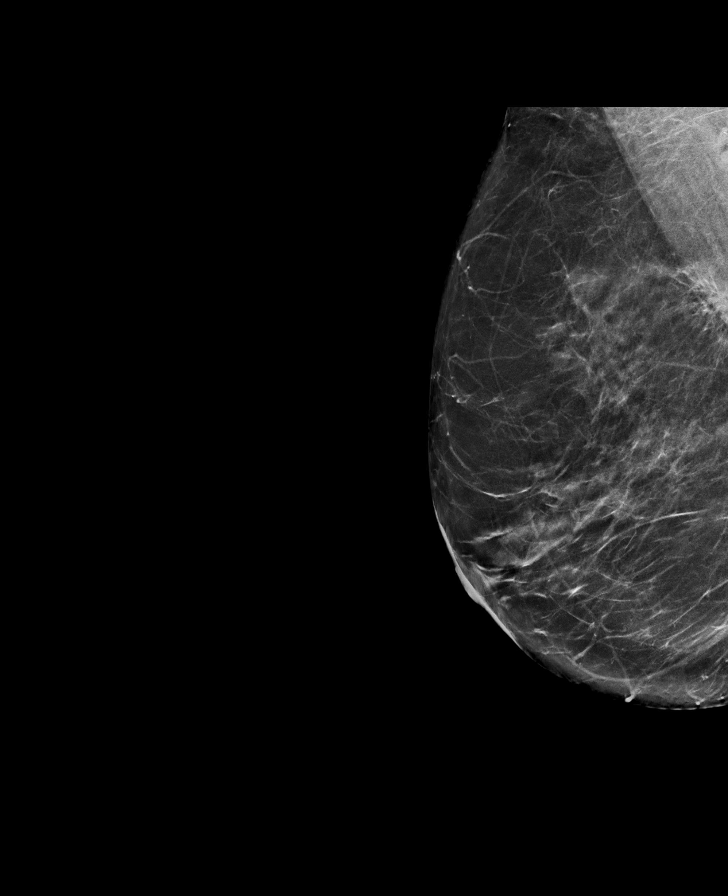

[L CC synth-2D]
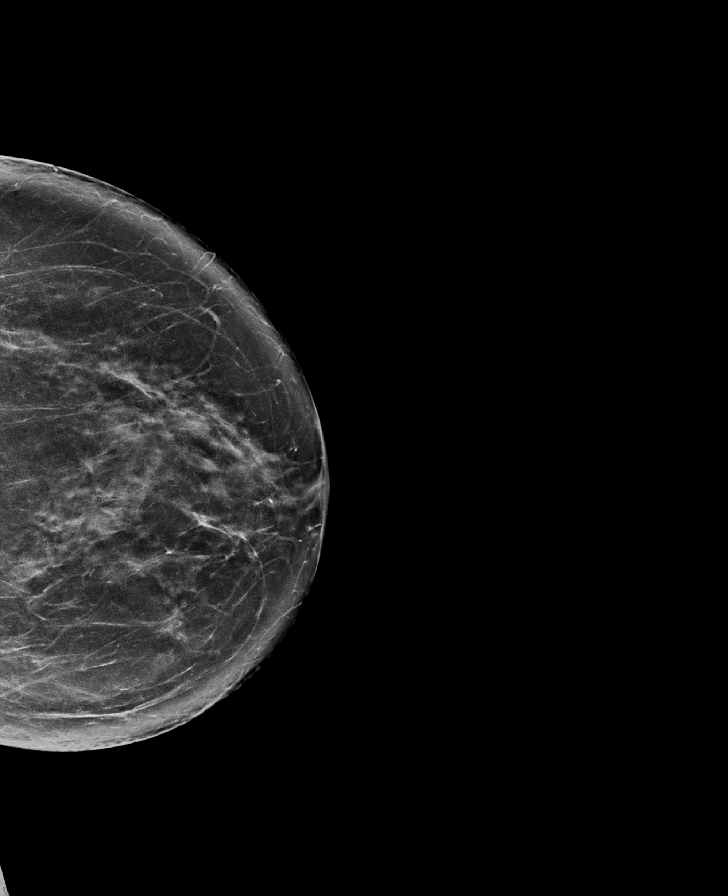

[R CC synth-2D]
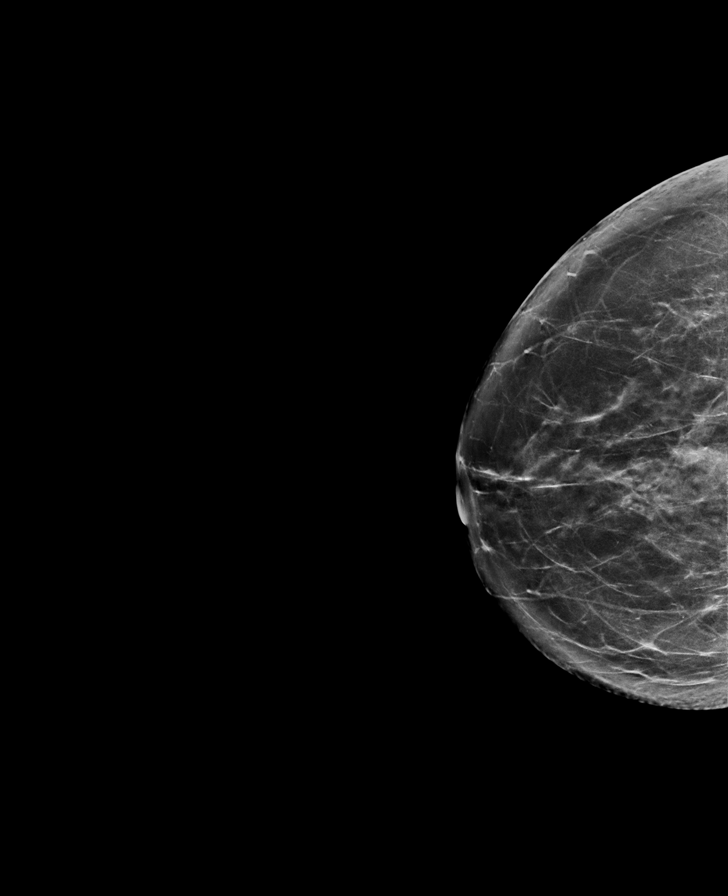

[L MLO synth-2D]
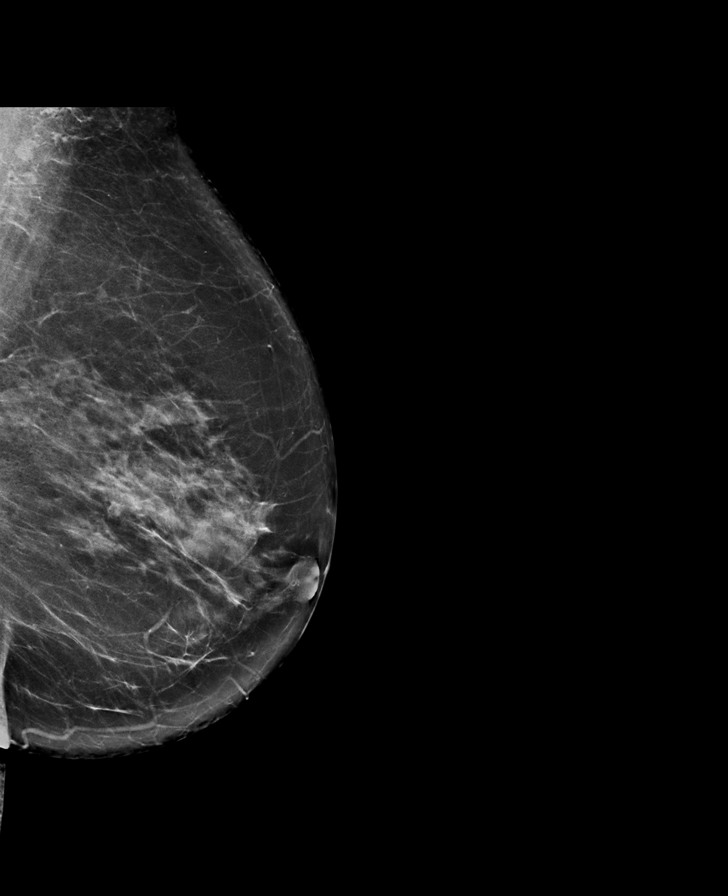

[R CC tomo · tomo slice 39/78.0]
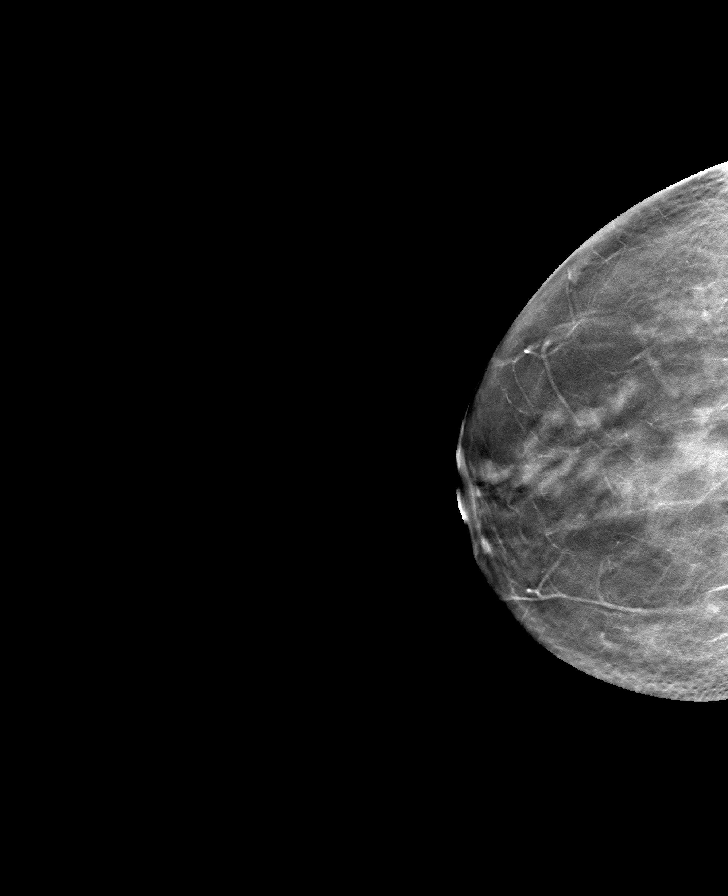

[L MLO tomo · tomo slice 40/79.0]
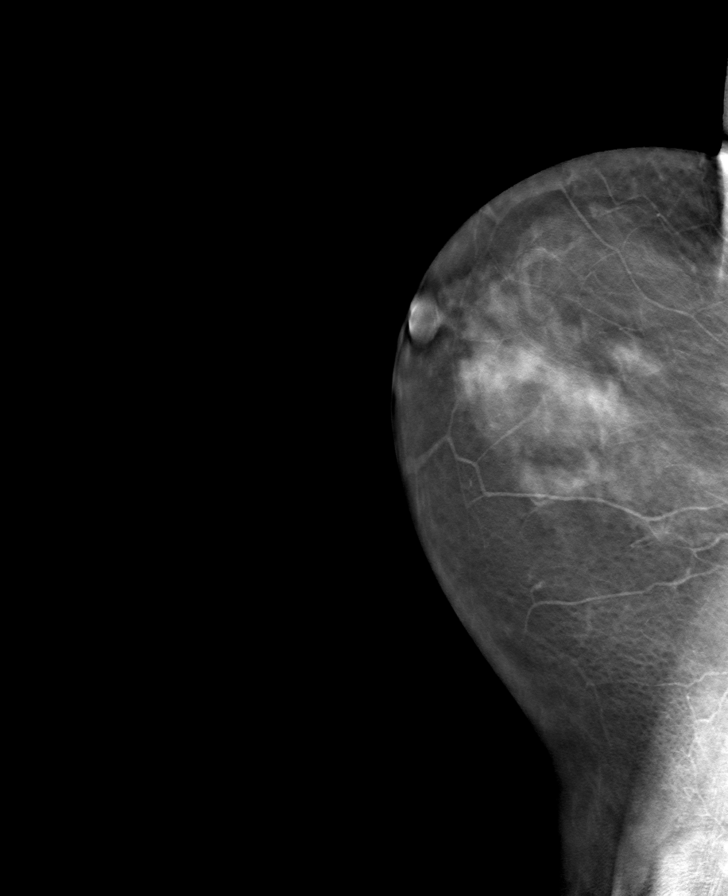

[L CC tomo · tomo slice 37/74.0]
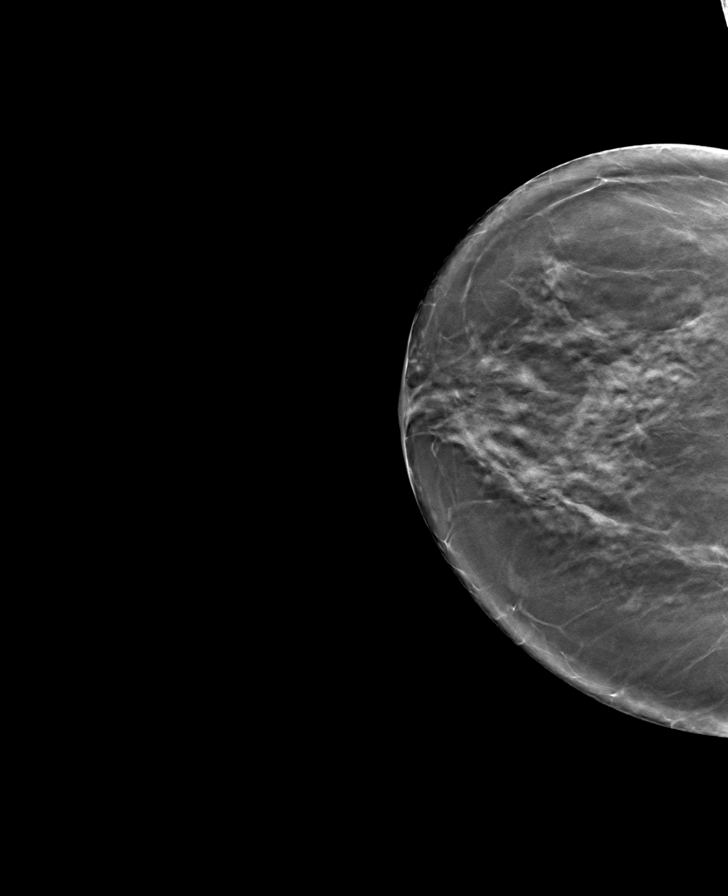

[R MLO tomo · tomo slice 37/72.0]
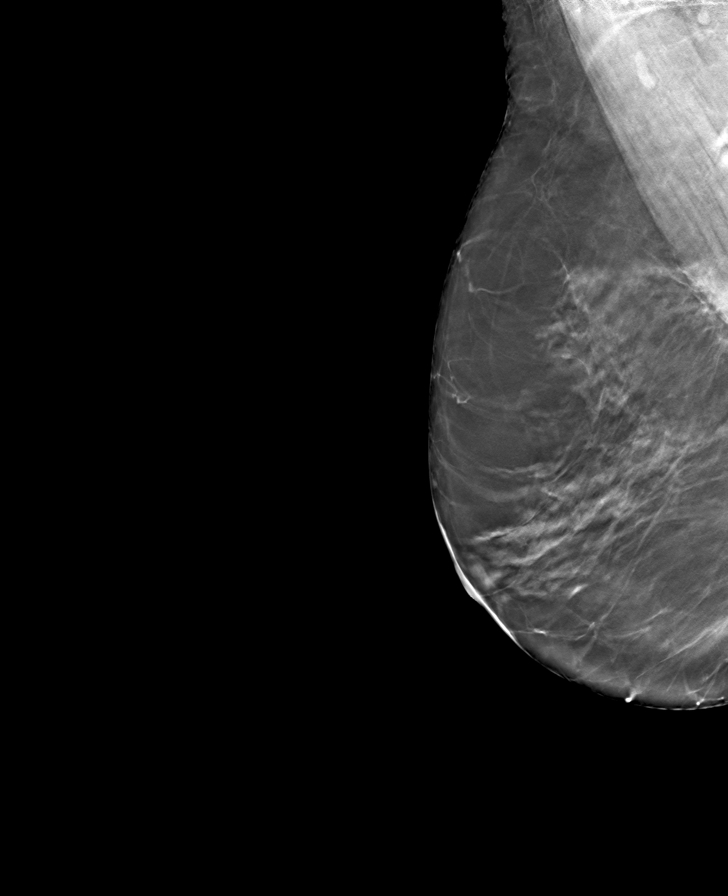

[8 of 24 positions shown; findings below may reference images not displayed]

ACR Breast Density Category c: The breast tissue is heterogeneously
dense, which may obscure small masses.
FINDINGS: In the right breast, possible distortion in the posterior, upper
breast slightly laterally warrants further evaluation. In the left
breast, no findings suspicious for malignancy. Images were processed
with CAD.
IMPRESSION: Further evaluation is suggested for possible distortion in the right
breast.

RECOMMENDATION:
Diagnostic mammogram and possibly ultrasound of the right breast.
(Code:[id number]-D-TTT)

The patient will be contacted regarding the findings, and additional
imaging will be scheduled.

BI-RADS CATEGORY  0: Incomplete. Need additional imaging evaluation
and/or prior mammograms for comparison.

## 2022-07-24 ENCOUNTER — Encounter: Payer: Self-pay | Admitting: Family Medicine

## 2022-07-24 ENCOUNTER — Ambulatory Visit (INDEPENDENT_AMBULATORY_CARE_PROVIDER_SITE_OTHER): Payer: 59 | Admitting: Family Medicine

## 2022-07-24 VITALS — BP 128/68 | HR 97 | Temp 97.8°F | Resp 16 | Ht 67.0 in | Wt 139.8 lb

## 2022-07-24 DIAGNOSIS — R0609 Other forms of dyspnea: Secondary | ICD-10-CM

## 2022-07-24 DIAGNOSIS — N1831 Chronic kidney disease, stage 3a: Secondary | ICD-10-CM | POA: Diagnosis not present

## 2022-07-24 DIAGNOSIS — E559 Vitamin D deficiency, unspecified: Secondary | ICD-10-CM | POA: Diagnosis not present

## 2022-07-24 DIAGNOSIS — E785 Hyperlipidemia, unspecified: Secondary | ICD-10-CM

## 2022-07-24 DIAGNOSIS — M810 Age-related osteoporosis without current pathological fracture: Secondary | ICD-10-CM

## 2022-07-24 DIAGNOSIS — R052 Subacute cough: Secondary | ICD-10-CM

## 2022-07-24 DIAGNOSIS — U099 Post covid-19 condition, unspecified: Secondary | ICD-10-CM

## 2022-07-24 MED ORDER — BUDESONIDE-FORMOTEROL FUMARATE 160-4.5 MCG/ACT IN AERO: 2.0000 | INHALATION_SPRAY | Freq: Two times a day (BID) | RESPIRATORY_TRACT | 3 refills | Status: DC

## 2022-07-24 MED ORDER — PREDNISONE 20 MG PO TABS: ORAL_TABLET | ORAL | 0 refills | Status: DC

## 2022-07-24 NOTE — Patient Instructions (Signed)
Do the inhaler and if your breathing symptoms are not getting any better in a few weeks, please get the chest x-ray at the outpatient imaging and come back into the office for me to recheck you.

## 2022-07-24 NOTE — Progress Notes (Signed)
Name: Cheryl [name]   MRN: [id number]    DOB: 08/18/1950   Date:07/24/2022       Progress Note  Chief Complaint  Patient presents with   Follow-up   Hyperlipidemia     Subjective:   Cheryl [name] is a 72 y.o. female, presents to clinic for routine f/up  HLD lipids were well controlled last OV, she has stayed on it - except for one week where she held it wondering if she was having SE from it - she noticed no difference and resumed crestor 10 daily Lab Results  Component Value Date   CHOL 169 02/11/2022   HDL 66 02/11/2022   LDLCALC 86 02/11/2022   TRIG 80 02/11/2022   CHOLHDL 2.6 02/11/2022   Mild decrease in GFR Lab Results  Component Value Date   EGFR 54 (L) 02/11/2022   EGFR 66 04/30/2021   Pt ill since having covid over a month ago Coughing, SOB, denies CP, fever sweats  Osteoporosis on fosamax, due for dexa in October  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 TAKE WITH A FULL GLASS OF WATER ON AN EMPTY STOMACH., Disp: 12 tablet, Rfl: 2   anastrozole (ARIMIDEX) 1 MG tablet, TAKE 1 TABLET BY MOUTH EVERY DAY, Disp: 90 tablet, Rfl: 1   ibuprofen (ADVIL) 800 MG tablet, Take 1 tablet (800 mg total) by mouth every 8 (eight) hours as needed., Disp: 30 tablet, Rfl: 0   Multiple Vitamin (MULTIVITAMIN WITH MINERALS) TABS tablet, Take 1 tablet by mouth daily., Disp: , Rfl:    rosuvastatin (CRESTOR) 10 MG tablet, Take 1 tablet (10 mg total) by mouth daily., Disp: 90 tablet, Rfl: 3  Patient Active Problem List   Diagnosis Date Noted   History of breast cancer 02/11/2022   Malignant neoplasm of upper-outer quadrant of right breast in female, estrogen receptor positive ([place]) 08/15/2020   Hyperlipidemia 06/21/2019   Vitamin D deficiency 06/21/2019   Hematuria 06/21/2019   Family history of breast cancer in first  degree relative 06/21/2019   Osteoporosis without current pathological fracture 06/17/2019   Left shoulder pain 06/17/2019    Past Surgical History:  Procedure Laterality Date   BREAST BIOPSY Right 08/08/2020   U/S bx- GRADE II INVASIVE MAMMARY CARCINOMA   BREAST LUMPECTOMY Right 09/25/2020   Invasive lobular carcinoma   BREAST LUMPECTOMY WITH RADIOACTIVE SEED AND SENTINEL LYMPH NODE BIOPSY Right 09/25/2020   Procedure: RIGHT BREAST LUMPECTOMY WITH RADIOACTIVE SEED AND RIGHT SENTINEL LYMPH NODE BIOPSY;  Surgeon: Erroll Luna, MD;  Location: [facility];  Service: General;  Laterality: Right;    Family History  Problem Relation Age of Onset   Breast cancer Mother 68   Breast cancer Maternal Aunt 70   Breast cancer Maternal Aunt 70   Throat cancer Father    Stroke Brother     Social History   Tobacco Use   Smoking status: Never   Smokeless tobacco: Never  Vaping Use   Vaping Use: Never used  Substance Use Topics   Alcohol use: Never   Drug use: Never     No Known Allergies  Health Maintenance  Topic Date Due   Zoster Vaccines- Shingrix (1 of 2) Never done   Pneumonia Vaccine 38+ Years old (1 of 1 - PCV) 01/10/2023 (Originally 09/22/2017)  MAMMOGRAM  09/19/2022   Medicare Annual Wellness (AWV)  01/10/2023   DEXA SCAN  03/21/2023   Fecal DNA (Cologuard)  06/16/2023   DTaP/Tdap/Td (2 - Td or Tdap) 04/20/2029   Hepatitis C Screening  Completed   HPV VACCINES  Aged Out   INFLUENZA VACCINE  Discontinued   COVID-19 Vaccine  Discontinued    Chart Review Today: I personally reviewed active problem list, medication list, allergies, family history, social history, health maintenance, notes from last encounter, lab results, imaging with the patient/caregiver today.  Review of Systems  Constitutional: Negative.   HENT: Negative.    Eyes: Negative.   Respiratory: Negative.    Cardiovascular: Negative.   Gastrointestinal: Negative.   Endocrine: Negative.    Genitourinary: Negative.   Musculoskeletal: Negative.   Skin: Negative.   Allergic/Immunologic: Negative.   Neurological: Negative.   Hematological: Negative.   Psychiatric/Behavioral: Negative.    All other systems reviewed and are negative.    Objective:   Vitals:   07/24/22 1140  BP: 128/68  Pulse: 97  Resp: 16  Temp: 97.8 F (36.6 C)  TempSrc: Oral  SpO2: 99%  Weight: 139 lb 12.8 oz (63.4 kg)  Height: 5' 7"$  (1.702 m)    Body mass index is 21.9 kg/m.  Physical Exam Vitals and nursing note reviewed.  Constitutional:      General: She is not in acute distress.    Appearance: Normal appearance. She is well-developed and normal weight. She is not ill-appearing, toxic-appearing or diaphoretic.     Interventions: Face mask in place.  HENT:     Head: Normocephalic and atraumatic.     Right Ear: External ear normal.     Left Ear: External ear normal.  Eyes:     General:        Right eye: No discharge.        Left eye: No discharge.     Conjunctiva/sclera: Conjunctivae normal.  Neck:     Trachea: No tracheal deviation.  Cardiovascular:     Rate and Rhythm: Normal rate and regular rhythm.  Pulmonary:     Effort: Pulmonary effort is normal. No respiratory distress.     Breath sounds: No stridor. Rhonchi (coarse BS throughout) present. No wheezing or rales.  Abdominal:     General: Bowel sounds are normal.     Palpations: Abdomen is soft.  Musculoskeletal:        General: Normal range of motion.  Skin:    General: Skin is warm and dry.     Findings: No rash.  Neurological:     Mental Status: She is alert.     Motor: No abnormal muscle tone.     Coordination: Coordination normal.  Psychiatric:        Mood and Affect: Mood normal.        Behavior: Behavior normal.         Assessment & Plan:     ICD-10-CM   1. Hyperlipidemia, unspecified hyperlipidemia type  E78.5 DG Chest 2 View   lipids were well controlled with last labs on crestor 10 mg, no SE,  though she is often concerned about meds, lipids annually, encourage her to take daily    2. Vitamin D deficiency  E55.9    last levels optimal - continue supplement    3. Osteoporosis without current pathological fracture, unspecified osteoporosis type  M81.0    still taking fosamax w/o SE, last vit D was in normal range, dexa due OCt 2024  4. Stage 3a chronic kidney disease ([place])  N18.31    Pt currently ill, will recheck renal function and labs later when well or an next f/up appt    5. Subacute cough  R05.2 budesonide-formoterol (SYMBICORT) 160-4.5 MCG/ACT inhaler    predniSONE (DELTASONE) 20 MG tablet   COVID + Jan , still coughing, dry, congestion in chest, DOE with stairs    6. DOE (dyspnea on exertion)  R06.09 budesonide-formoterol (SYMBICORT) 160-4.5 MCG/ACT inhaler    DG Chest 2 View   suspect bronchitis vs long covid, steroid burst, trial of symbicort, f/up with CXR if not improving, may need pulm/pft    7. COVID-19 long hauler manifesting chronic dyspnea  R06.09 budesonide-formoterol (SYMBICORT) 160-4.5 MCG/ACT inhaler   U09.9 DG Chest 2 View         Return for 2-4 week f/up if cough and breathing are not better, 6 m f/up routine .   Delsa Grana, PA-C 07/24/22 11:49 AM

## 2022-08-15 ENCOUNTER — Ambulatory Visit: Payer: 59 | Admitting: Urology

## 2022-08-15 NOTE — Progress Notes (Deleted)
08/15/2022 9:37 AM   Cheryl [name] 04-Feb-1951 [id number]  Referring provider: Delsa Grana, PA-C [address] [place],  [place] [address]  Urological history: 1.  High risk hematuria -Non-smoker -CTU (08/2019) - NED -cysto (08/2019) -  Small urethral polyp at the 1 o'clock position approximately, benign-appearing  -reports of gross heme    No chief complaint on file.   HPI: Cheryl [name] is a 72 y.o. female who presents today for gross hematuria and abdominal pain.   She was last seen in our office by Dr. Hollice Espy on April 15, 2022 for the complaint of gross hematuria.  At that visit Dr. Erlene Quan had recommended a renal ultrasound and repeat cystoscopy.  Unfortunately, the patient canceled these appointments and did not reschedule.  UA ***  PVR ***   PMH: Past Medical History:  Diagnosis Date   Anxiety    Breast cancer ([place]) 07/2020   right breast ILC   Hyperlipidemia    Osteoporosis     Surgical History: Past Surgical History:  Procedure Laterality Date   BREAST BIOPSY Right 08/08/2020   U/S bx- GRADE II INVASIVE MAMMARY CARCINOMA   BREAST LUMPECTOMY Right 09/25/2020   Invasive lobular carcinoma   BREAST LUMPECTOMY WITH RADIOACTIVE SEED AND SENTINEL LYMPH NODE BIOPSY Right 09/25/2020   Procedure: RIGHT BREAST LUMPECTOMY WITH RADIOACTIVE SEED AND RIGHT SENTINEL LYMPH NODE BIOPSY;  Surgeon: Erroll Luna, MD;  Location: [facility];  Service: General;  Laterality: Right;    Home Medications:  Allergies as of 08/15/2022   No Known Allergies      Medication List        Accurate as of August 15, 2022  9:37 AM. If you have any questions, ask your nurse or doctor.          alendronate 70 MG tablet Commonly known as: FOSAMAX TAKE 1 TABLET BY MOUTH ONCE A WEEK. TAKE WITH A FULL GLASS OF WATER ON AN EMPTY STOMACH.   anastrozole 1 MG tablet Commonly known as: ARIMIDEX TAKE 1 TABLET BY MOUTH EVERY DAY    budesonide-formoterol 160-4.5 MCG/ACT inhaler Commonly known as: SYMBICORT Inhale 2 puffs into the lungs 2 (two) times daily.   ibuprofen 800 MG tablet Commonly known as: ADVIL Take 1 tablet (800 mg total) by mouth every 8 (eight) hours as needed.   multivitamin with minerals Tabs tablet Take 1 tablet by mouth daily.   predniSONE 20 MG tablet Commonly known as: DELTASONE 2 tabs poqday 1-3, 1 tabs poqday 4-6   rosuvastatin 10 MG tablet Commonly known as: CRESTOR Take 1 tablet (10 mg total) by mouth daily.        Allergies: No Known Allergies  Family History: Family History  Problem Relation Age of Onset   Breast cancer Mother 37   Breast cancer Maternal Aunt 74   Breast cancer Maternal Aunt 70   Throat cancer Father    Stroke Brother     Social History:  reports that she has never smoked. She has never used smokeless tobacco. She reports that she does not drink alcohol and does not use drugs.  ROS: Pertinent ROS in HPI  Physical Exam: There were no vitals taken for this visit.  Constitutional:  Well nourished. Alert and oriented, No acute distress. HEENT: [place] AT, moist mucus membranes.  Trachea midline, no masses. Cardiovascular: No clubbing, cyanosis, or edema. Respiratory: Normal respiratory effort, no increased work of breathing. GU: No CVA tenderness.  No bladder fullness or  masses. Vulvovaginal atrophy w/ pallor, loss of rugae, introital retraction, excoriations.  Vulvar thinning, fusion of labia, clitoral hood retraction, prominent urethral meatus.   *** external genitalia, *** pubic hair distribution, no lesions.  Normal urethral meatus, no lesions, no prolapse, no discharge.   No urethral masses, tenderness and/or tenderness. No bladder fullness, tenderness or masses. *** vagina mucosa, *** estrogen effect, no discharge, no lesions, *** pelvic support, *** cystocele and *** rectocele noted.  No cervical motion tenderness.  Uterus is freely mobile and non-fixed.   No adnexal/parametria masses or tenderness noted.  Anus and perineum are without rashes or lesions.   ***  Neurologic: Grossly intact, no focal deficits, moving all 4 extremities. Psychiatric: Normal mood and affect.    Laboratory Data: Lab Results  Component Value Date   WBC 4.0 02/11/2022   HGB 12.5 02/11/2022   HCT 38.2 02/11/2022   MCV 89.7 02/11/2022   PLT 220 02/11/2022    Lab Results  Component Value Date   CREATININE 1.11 (H) 02/11/2022       Component Value Date/Time   CHOL 169 02/11/2022 1129   HDL 66 02/11/2022 1129   CHOLHDL 2.6 02/11/2022 1129   LDLCALC 86 02/11/2022 1129    Lab Results  Component Value Date   AST 17 02/11/2022   Lab Results  Component Value Date   ALT 11 02/11/2022    Urinalysis  See EPIC and HPI I have reviewed the labs.   Pertinent Imaging: *** Assessment & Plan:  ***  1. Gross hematuria -UA *** -Urine sent for culture -Advised patient that we need to get her scheduled for a renal ultrasound and cystoscopy with Dr. Erlene Quan for further evaluation as there is a concern for possible malignancy  No follow-ups on file.  These notes generated with voice recognition software. I apologize for typographical errors.  [place], [place] [address]  [place], [place] [address] [phone]

## 2022-09-04 ENCOUNTER — Telehealth: Payer: Self-pay | Admitting: Family Medicine

## 2022-09-04 DIAGNOSIS — Z1231 Encounter for screening mammogram for malignant neoplasm of breast: Secondary | ICD-10-CM

## 2022-09-04 NOTE — Telephone Encounter (Signed)
Copied from [place]. Topic: General - Other >> Sep 04, 2022  1:10 PM Cheryl [name] wrote: Reason for CRM: Pt is calling in requesting that Cheryl [name] give her a callback. Pt says the call is regarding a personal matter that she wanted to discuss with Cheryl [name]. Please follow up with pt.

## 2022-09-05 ENCOUNTER — Other Ambulatory Visit: Payer: Self-pay | Admitting: Internal Medicine

## 2022-09-05 DIAGNOSIS — Z853 Personal history of malignant neoplasm of breast: Secondary | ICD-10-CM

## 2022-09-10 ENCOUNTER — Ambulatory Visit
Admission: RE | Admit: 2022-09-10 | Discharge: 2022-09-10 | Disposition: A | Discharge status: Home / Self Care | Payer: 59 | Source: Ambulatory Visit | Attending: Internal Medicine | Admitting: Internal Medicine

## 2022-09-10 ENCOUNTER — Other Ambulatory Visit: Payer: Self-pay | Admitting: Internal Medicine

## 2022-09-10 ENCOUNTER — Telehealth: Payer: Self-pay | Admitting: Urology

## 2022-09-10 DIAGNOSIS — N6489 Other specified disorders of breast: Secondary | ICD-10-CM | POA: Diagnosis present

## 2022-09-10 DIAGNOSIS — Z853 Personal history of malignant neoplasm of breast: Secondary | ICD-10-CM

## 2022-09-10 NOTE — Telephone Encounter (Signed)
Would you reach out to Cheryl [name] and get her rescheduled for her missed appointment in March for blood in her urine.  Please let her know that blood in the urine has many causes, some that are benign and some that are worrisome, and have the potential to become life threatening and it is important to have continued follow up until a source of the bleeding is discovered, the bleeding is deemed benign, as sometime cancers take months to develop to a size that they can be seen with the currently available imaging techniques

## 2022-09-11 NOTE — Telephone Encounter (Signed)
LMOM for patient to return call and reschedule her missed appointment.

## 2022-09-12 NOTE — Telephone Encounter (Signed)
LMOM to return call. Second attempt.

## 2022-09-16 NOTE — Telephone Encounter (Signed)
LMOM to reschedule missed appointment. 3rd attempt.

## 2022-09-24 NOTE — Progress Notes (Signed)
09/25/2022 12:40 PM   Cheryl [name] 05-26-1951 [id number]  Referring provider: Danelle Berry, PA-C [address] [place],  [place] [address]  Urological history: 1.  High risk hematuria -Non-smoker -CTU (08/2019) - NED -cysto (08/2019) -  Small urethral polyp at the 1 o'clock position approximately, benign-appearing  -reports of gross heme  No chief complaint on file.   HPI: Cheryl [name] is a 72 y.o. female who presents today for follow up.   Previous records reviewed.   She was last seen in our office by Dr. Vanna Scotland on April 15, 2022 for the complaint of gross hematuria.  At that visit Dr. Apolinar Junes had recommended a renal ultrasound and repeat cystoscopy.  Unfortunately, the patient canceled these appointments and did not reschedule.   UA ***   PVR ***  PMH: Past Medical History:  Diagnosis Date   Anxiety    Breast cancer 07/2020   right breast ILC   Hyperlipidemia    Osteoporosis     Surgical History: Past Surgical History:  Procedure Laterality Date   BREAST BIOPSY Right 08/08/2020   U/S bx- GRADE II INVASIVE MAMMARY CARCINOMA   BREAST LUMPECTOMY Right 09/25/2020   Invasive lobular carcinoma   BREAST LUMPECTOMY WITH RADIOACTIVE SEED AND SENTINEL LYMPH NODE BIOPSY Right 09/25/2020   Procedure: RIGHT BREAST LUMPECTOMY WITH RADIOACTIVE SEED AND RIGHT SENTINEL LYMPH NODE BIOPSY;  Surgeon: Harriette Bouillon, MD;  Location: [facility];  Service: General;  Laterality: Right;    Home Medications:  Allergies as of 09/25/2022   No Known Allergies      Medication List        Accurate as of September 24, 2022 12:40 PM. If you have any questions, ask your nurse or doctor.          alendronate 70 MG tablet Commonly known as: FOSAMAX TAKE 1 TABLET BY MOUTH ONCE A WEEK. TAKE WITH A FULL GLASS OF WATER ON AN EMPTY STOMACH.   anastrozole 1 MG tablet Commonly known as: ARIMIDEX TAKE 1 TABLET BY MOUTH EVERY DAY    budesonide-formoterol 160-4.5 MCG/ACT inhaler Commonly known as: SYMBICORT Inhale 2 puffs into the lungs 2 (two) times daily.   ibuprofen 800 MG tablet Commonly known as: ADVIL Take 1 tablet (800 mg total) by mouth every 8 (eight) hours as needed.   multivitamin with minerals Tabs tablet Take 1 tablet by mouth daily.   predniSONE 20 MG tablet Commonly known as: DELTASONE 2 tabs poqday 1-3, 1 tabs poqday 4-6   rosuvastatin 10 MG tablet Commonly known as: CRESTOR Take 1 tablet (10 mg total) by mouth daily.        Allergies: No Known Allergies  Family History: Family History  Problem Relation Age of Onset   Breast cancer Mother 20   Breast cancer Maternal Aunt 73   Breast cancer Maternal Aunt 70   Throat cancer Father    Stroke Brother     Social History:  reports that she has never smoked. She has never used smokeless tobacco. She reports that she does not drink alcohol and does not use drugs.  ROS: Pertinent ROS in HPI  Physical Exam: There were no vitals taken for this visit.  Constitutional:  Well nourished. Alert and oriented, No acute distress. HEENT: [place] AT, moist mucus membranes.  Trachea midline, no masses. Cardiovascular: No clubbing, cyanosis, or edema. Respiratory: Normal respiratory effort, no increased work of breathing. GU: No CVA tenderness.  No bladder fullness or masses.  Vulvovaginal atrophy w/ pallor, loss of rugae, introital retraction, excoriations.  Vulvar thinning, fusion of labia, clitoral hood retraction, prominent urethral meatus.   *** external genitalia, *** pubic hair distribution, no lesions.  Normal urethral meatus, no lesions, no prolapse, no discharge.   No urethral masses, tenderness and/or tenderness. No bladder fullness, tenderness or masses. *** vagina mucosa, *** estrogen effect, no discharge, no lesions, *** pelvic support, *** cystocele and *** rectocele noted.  No cervical motion tenderness.  Uterus is freely mobile and non-fixed.   No adnexal/parametria masses or tenderness noted.  Anus and perineum are without rashes or lesions.   ***  Neurologic: Grossly intact, no focal deficits, moving all 4 extremities. Psychiatric: Normal mood and affect.    Laboratory Data: Component Ref Range & Units 3 mo ago  WBC (White Blood Cell Count) 4.1 - 10.2 10^3/uL 4.4  RBC (Red Blood Cell Count) 4.04 - 5.48 10^6/uL 4.04  Hemoglobin 12.0 - 15.0 gm/dL 16.1 Low   Hematocrit 09.6 - 47.0 % 35.9  MCV (Mean Corpuscular Volume) 80.0 - 100.0 fl 88.9  MCH (Mean Corpuscular Hemoglobin) 27.0 - 31.2 pg 29.0  MCHC (Mean Corpuscular Hemoglobin Concentration) 32.0 - 36.0 gm/dL 04.5  Platelet Count 409 - 450 10^3/uL 244  RDW-CV (Red Cell Distribution Width) 11.6 - 14.8 % 15.5 High   MPV (Mean Platelet Volume) 9.4 - 12.4 fl 9.0 Low   Neutrophils 1.50 - 7.80 10^3/uL 2.00  Lymphocytes 1.00 - 3.60 10^3/uL 1.86  Monocytes 0.00 - 1.50 10^3/uL 0.50  Eosinophils 0.00 - 0.55 10^3/uL 0.05  Basophils 0.00 - 0.09 10^3/uL 0.02  Neutrophil % 32.0 - 70.0 % 45.0  Lymphocyte % 10.0 - 50.0 % 41.9  Monocyte % 4.0 - 13.0 % 11.3  Eosinophil % 1.0 - 5.0 % 1.1  Basophil% 0.0 - 2.0 % 0.5  Immature Granulocyte % <=0.7 % 0.2  Immature Granulocyte Count <=0.06 10^3/L 0.01  Resulting Agency [facility]   Specimen Collected: 06/20/22 13:23   Performed by: [facility] Last Resulted: 06/20/22 13:35  Received From: [facility]  Result Received: 07/23/22 11:38   Urinalysis ***  I have reviewed the labs.   Pertinent Imaging: N/A  Assessment & Plan:  ***  1. Gross hematuria -UA *** -Urine sent for culture -Advised patient that we need to get her scheduled for a renal ultrasound and cystoscopy with Dr. Apolinar Junes for further evaluation as there is a concern for possible malignancy  No follow-ups on file.  These notes generated with voice recognition software. I apologize for typographical  errors.  Cloretta Ned  [facility] [address]  [address] [place], [place] [address] [phone]

## 2022-09-25 ENCOUNTER — Encounter: Payer: Self-pay | Admitting: Urology

## 2022-09-25 ENCOUNTER — Ambulatory Visit (INDEPENDENT_AMBULATORY_CARE_PROVIDER_SITE_OTHER): Payer: 59 | Admitting: Urology

## 2022-09-25 VITALS — BP 154/77 | HR 71 | Ht 67.0 in | Wt 139.0 lb

## 2022-09-25 DIAGNOSIS — R310 Gross hematuria: Secondary | ICD-10-CM | POA: Diagnosis not present

## 2022-09-25 LAB — URINALYSIS, COMPLETE
Bilirubin, UA: NEGATIVE
Glucose, UA: NEGATIVE
Ketones, UA: NEGATIVE
Nitrite, UA: NEGATIVE
Protein,UA: NEGATIVE
Specific Gravity, UA: <1.005 — ABNORMAL LOW (ref 1.005–1.030)
Urobilinogen, Ur: 0.2 mg/dL (ref 0.2–1.0)
pH, UA: 6 (ref 5.0–7.5)

## 2022-09-25 LAB — MICROSCOPIC EXAMINATION

## 2022-09-25 NOTE — Progress Notes (Signed)
Cheryl [name] presents for an office/procedure visit. BP today is 154/77__. He/She is complaint/noncompliant with BP medication. Greater than 140/90. Provider  notified. Pt advised to follow up with PCP_. Pt voiced understanding.

## 2022-09-30 LAB — CULTURE, URINE COMPREHENSIVE

## 2022-10-12 ENCOUNTER — Other Ambulatory Visit: Payer: Self-pay | Admitting: Internal Medicine

## 2022-10-31 IMAGING — MR MR BREAST BILAT WO/W CM
9 of 10 series · 29 of 48 positions shown · IV contrast (6ML GADAVIST)
Comparison: Previous exam(s).

CLINICAL DATA: Biopsy proven invasive mammary carcinoma in the 10
o'clock region of the right breast.

LABS:  None obtained at the time of imaging.
EXAM:
BILATERAL BREAST MRI WITH AND WITHOUT CONTRAST
TECHNIQUE: Multiplanar, multisequence MR images of both breasts were obtained
prior to and following the intravenous administration of 6 ml of
Gadavist

[Series 2: t2_tirm_tra ipat (a-p) · axial · 3.0mm · 0.74mm/px · 1 of 72 slices shown]
[im 1/72]
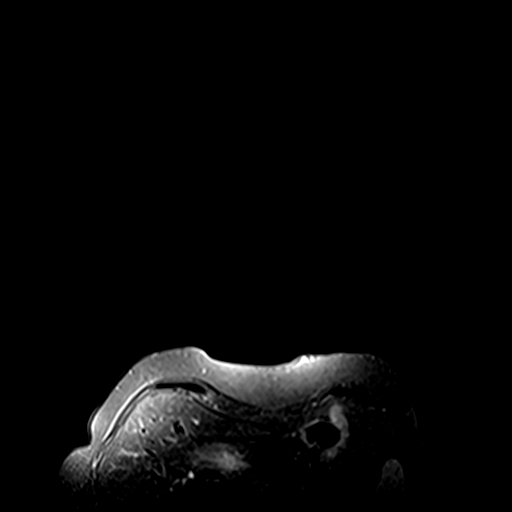

[Series 3: fl3d pre-cm no · axial · non-contrast · 0.9mm · 0.94mm/px · z∈[-89,+126]mm · 3 of 240 slices shown]
[im 1/240]
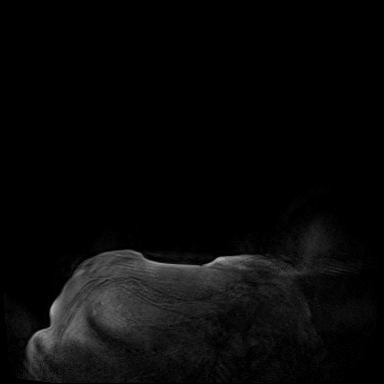
[im 120/240]
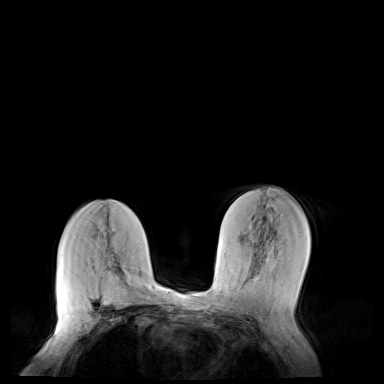
[im 240/240]
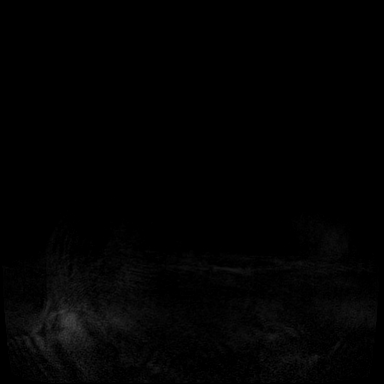

[Series 4: fl3d pre-cm · axial · non-contrast · 0.9mm · 0.87mm/px · z∈[-89,+126]mm · 4 of 240 slices shown]
[im 1/240]
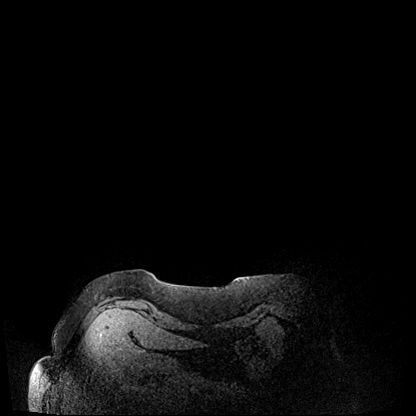
[im 80/240]
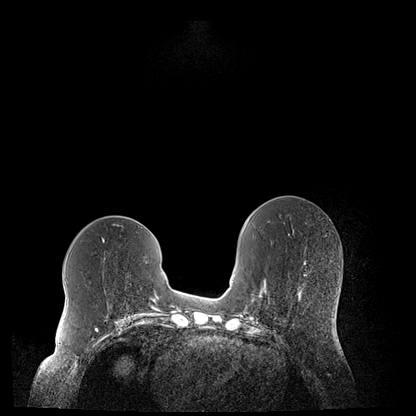
[im 160/240]
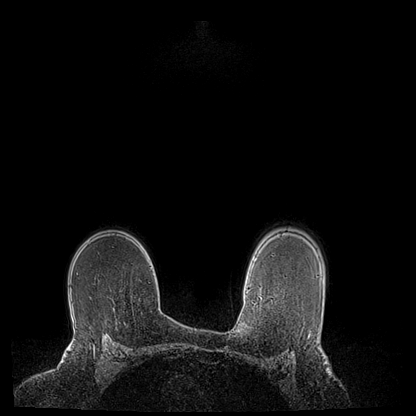
[im 240/240]
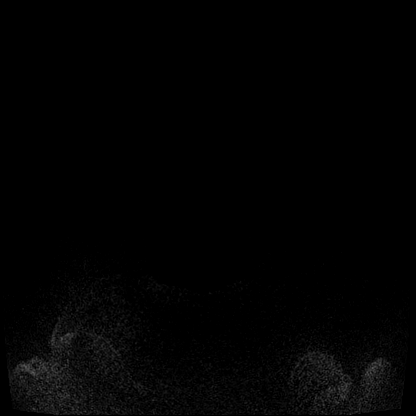

[Series 5: fl3d post-cm 20 · axial · 0.9mm · 0.87mm/px · z∈[-89,+126]mm · 4 of 240 slices shown (1 of 2)]
[im 1/240]
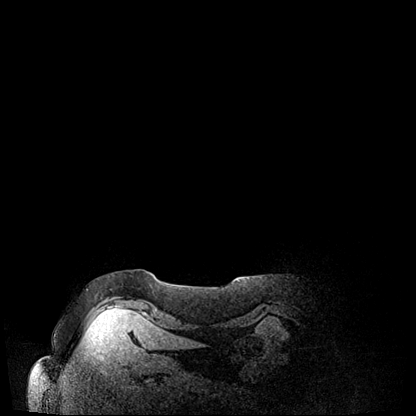
[im 80/240]
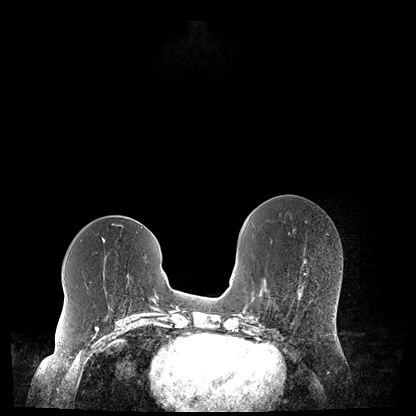
[im 160/240]
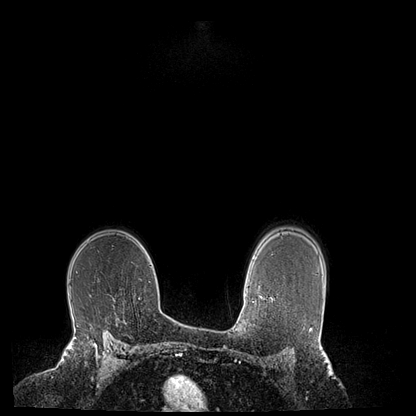
[im 240/240]
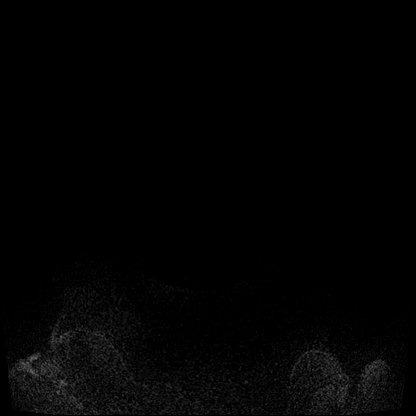

[Series 6: fl3d post-cm 20 · axial · 0.9mm · 0.87mm/px · z∈[-89,+126]mm · 4 of 240 slices shown (2 of 2)]
[im 1/240]
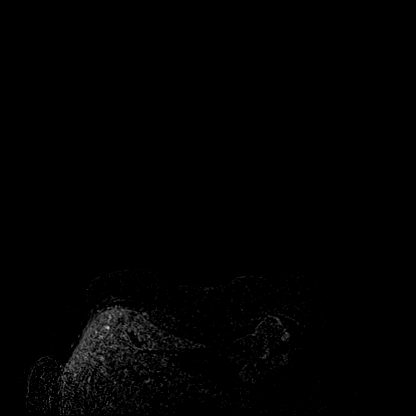
[im 80/240]
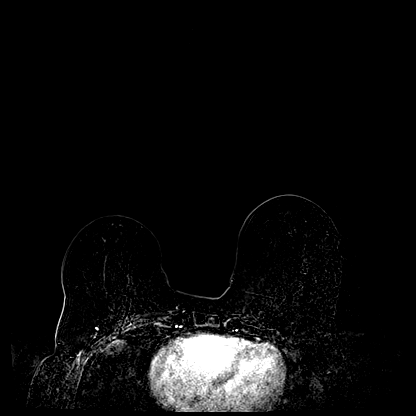
[im 160/240]
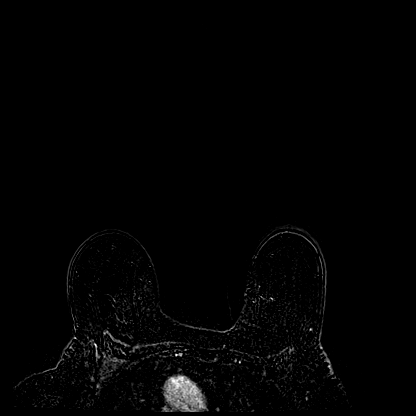
[im 240/240]
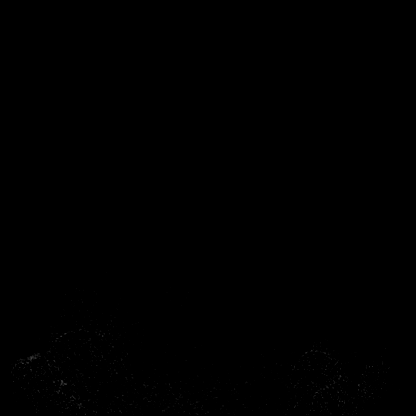

[Series 8: fl3d post-cm 3min · axial · 0.9mm · 0.87mm/px · z∈[-89,+126]mm · 4 of 240 slices shown]
[im 1/240]
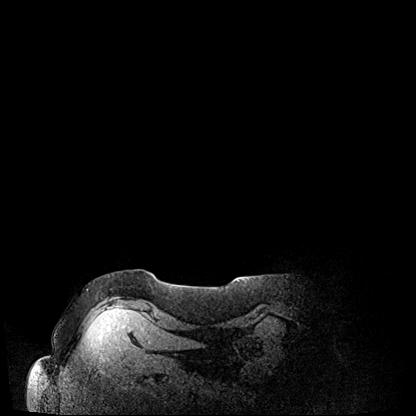
[im 80/240]
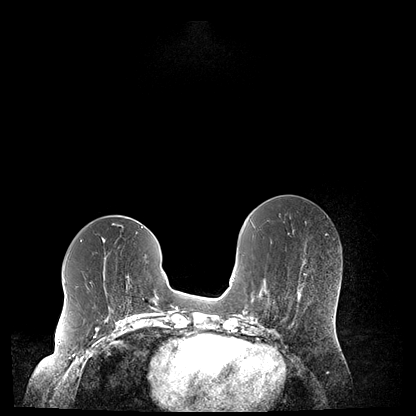
[im 160/240]
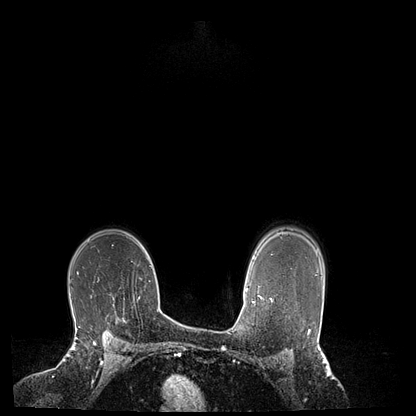
[im 240/240]
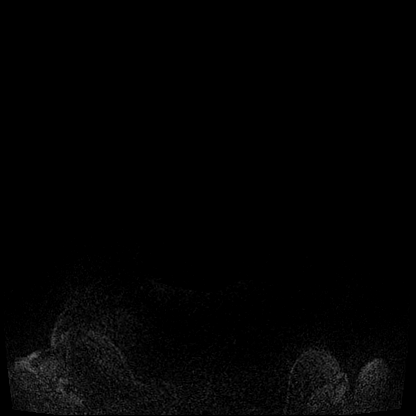

[Series 9: fl3d post-cm 3min_sub · axial · 0.9mm · 0.87mm/px · z∈[-89,+126]mm · 4 of 240 slices shown]
[im 1/240]
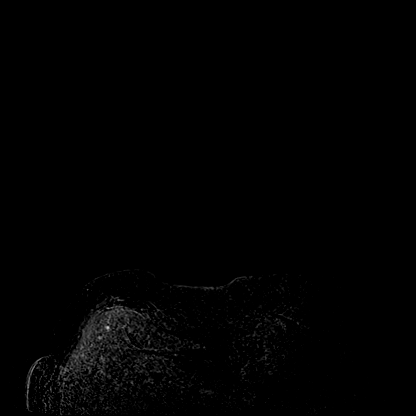
[im 80/240]
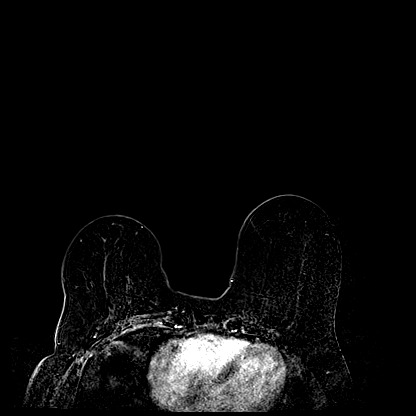
[im 160/240]
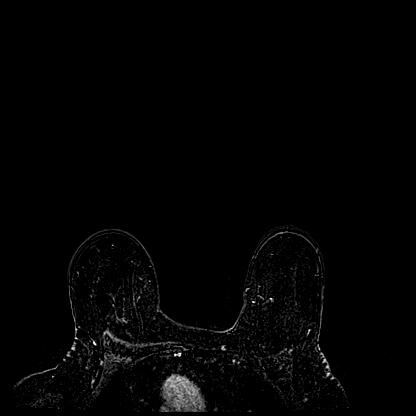
[im 240/240]
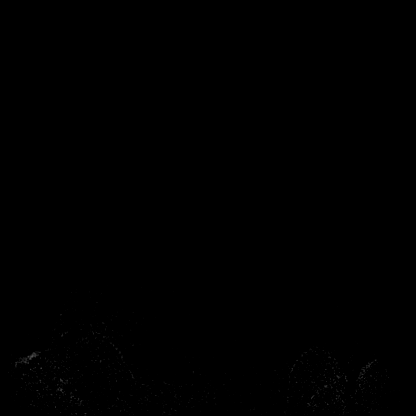

[Series 11: fl3d post-cm 5min · axial · 0.9mm · 0.87mm/px · z∈[-89,+126]mm · 4 of 240 slices shown]
[im 1/240]
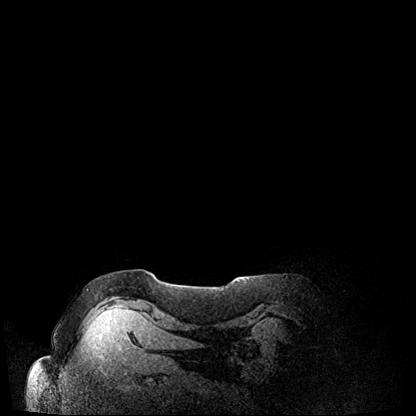
[im 80/240]
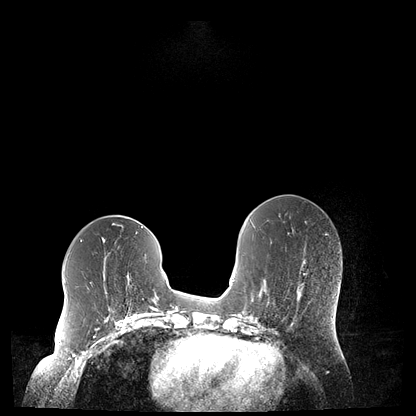
[im 160/240]
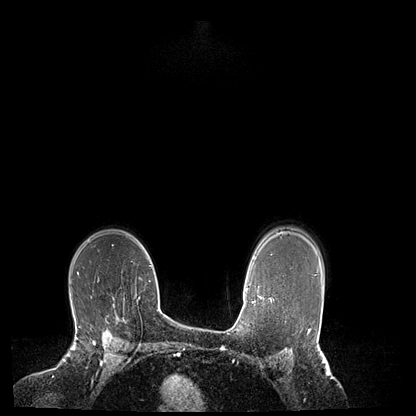
[im 240/240]
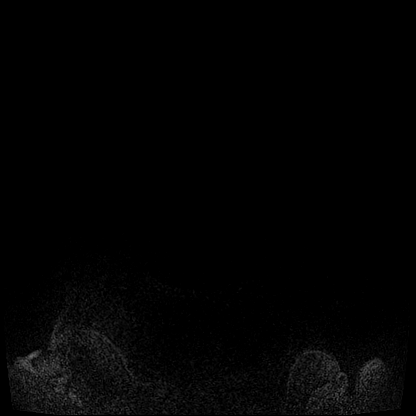

[Series 12: fl3d post-cm 5min_sub · axial · 0.9mm · 0.87mm/px · 1 of 240 slices shown]
[im 1/240]
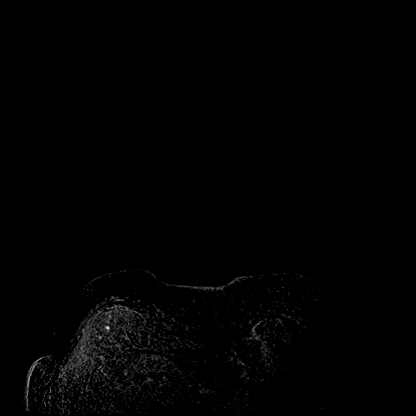

[29 of 48 positions shown; findings below may reference images not displayed]

Three-dimensional MR images were rendered by post-processing of the
original MR data on an independent workstation. The
three-dimensional MR images were interpreted, and findings are
reported in the following complete MRI report for this study. Three
dimensional images were evaluated at the independent interpreting
workstation using the DynaCAD thin client.
FINDINGS: Breast composition: c. Heterogeneous fibroglandular tissue.

Background parenchymal enhancement: Moderate.

Right breast: There is an irregular spiculated mass far posteriorly
in the upper-outer quadrant of the right breast (series 12, image
133) measuring 3.2 x 1.5 x 4.4 cm. The mass abuts and likely invades
the pectoralis muscle. A signal void artifact is seen in the mass
from the biopsy marker clip.

Left breast: No mass or abnormal enhancement.

Lymph nodes: No abnormal axillary adenopathy identified.

Ancillary findings:  None.
IMPRESSION: 4.4 cm irregular enhancing mass far posteriorly in the upper-outer
quadrant of the right breast abutting and likely invading the
pectoralis muscle. The mass corresponds with the biopsy proven
invasive mammary carcinoma.

RECOMMENDATION:
Treatment planning of the biopsy proven invasive mammary carcinoma
in the right breast is recommended.

BI-RADS CATEGORY  6: Known biopsy-proven malignancy.

## 2022-11-07 ENCOUNTER — Telehealth: Payer: Self-pay

## 2022-11-07 DIAGNOSIS — R3129 Other microscopic hematuria: Secondary | ICD-10-CM

## 2022-11-07 NOTE — Telephone Encounter (Signed)
Left message for patient to call back to r/s her upcoming appointment with Dr Apolinar Junes scheduled for 11/11/22, Renal US needs to be done prior to her appointment for Cystoscopy. Per Chart patient was contacted several months ago but did not call back to reschedule. Order expired. New order placed.

## 2022-11-10 NOTE — Telephone Encounter (Signed)
Called and left message for patient again.

## 2022-11-10 NOTE — Telephone Encounter (Signed)
Spoke with pt. Pt. Moved appt to 07/16 and will have Renal Ultrasound done prior. Patient verbalized understanding.

## 2022-11-11 ENCOUNTER — Other Ambulatory Visit: Payer: 59 | Admitting: Urology

## 2022-11-18 ENCOUNTER — Ambulatory Visit: Payer: 59 | Admitting: Family Medicine

## 2022-11-21 ENCOUNTER — Ambulatory Visit: Payer: 59

## 2022-12-16 ENCOUNTER — Other Ambulatory Visit: Payer: 59 | Admitting: Urology

## 2022-12-16 ENCOUNTER — Encounter: Payer: Self-pay | Admitting: Urology

## 2023-01-16 ENCOUNTER — Ambulatory Visit: Payer: Self-pay

## 2023-01-16 NOTE — Telephone Encounter (Signed)
Summary: severe cough   Pt states she is having severe coughing spells.  Started yesterday. Sometimes it feels like fire coming out.  She wonders if she needs a covid test. No appts at [facility].     Chief Complaint: Cough, mild SOB with coughing spells. Wants COVID testing. Symptoms: Above Frequency: 2 weeks ago Pertinent Negatives: Patient denies fever Disposition: [] ED /[] Urgent Care (no appt availability in office) / [] Appointment(In office/virtual)/ []  [facility]/ [] Home Care/ [] Refused Recommended Disposition /[] [facility]/ []  Follow-up with PCP Additional Notes: No availability in the practice. Agrees with UC.  Reason for Disposition  [1] MILD difficulty breathing (e.g., minimal/no SOB at rest, SOB with walking, pulse <100) AND [2] still present when not coughing  Answer Assessment - Initial Assessment Questions 1. ONSET: "When did the cough begin?"      Wednesday 2. SEVERITY: "How bad is the cough today?"      Severe 3. SPUTUM: "Describe the color of your sputum" (none, dry cough; clear, white, yellow, green)     Clear 4. HEMOPTYSIS: "Are you coughing up any blood?" If so ask: "How much?" (flecks, streaks, tablespoons, etc.)     No 5. DIFFICULTY BREATHING: "Are you having difficulty breathing?" If Yes, ask: "How bad is it?" (e.g., mild, moderate, severe)    - MILD: No SOB at rest, mild SOB with walking, speaks normally in sentences, can lie down, no retractions, pulse < 100.    - MODERATE: SOB at rest, SOB with minimal exertion and prefers to sit, cannot lie down flat, speaks in phrases, mild retractions, audible wheezing, pulse 100-120.    - SEVERE: Very SOB at rest, speaks in single words, struggling to breathe, sitting hunched forward, retractions, pulse > 120      Mild  6. FEVER: "Do you have a fever?" If Yes, ask: "What is your temperature, how was it measured, and when did it start?"     No 7. CARDIAC HISTORY: "Do you have any history of heart  disease?" (e.g., heart attack, congestive heart failure)      No 8. LUNG HISTORY: "Do you have any history of lung disease?"  (e.g., pulmonary embolus, asthma, emphysema)     No 9. PE RISK FACTORS: "Do you have a history of blood clots?" (or: recent major surgery, recent prolonged travel, bedridden)     No 10. OTHER SYMPTOMS: "Do you have any other symptoms?" (e.g., runny nose, wheezing, chest pain)       No 11. PREGNANCY: "Is there any chance you are pregnant?" "When was your last menstrual period?"       No 12. TRAVEL: "Have you traveled out of the country in the last month?" (e.g., travel history, exposures)       No  Protocols used: Cough - Acute Productive-A-AH

## 2023-01-28 ENCOUNTER — Other Ambulatory Visit: Payer: Self-pay | Admitting: Internal Medicine

## 2023-01-28 DIAGNOSIS — N6489 Other specified disorders of breast: Secondary | ICD-10-CM

## 2023-01-29 ENCOUNTER — Ambulatory Visit: Payer: 59 | Admitting: Family Medicine

## 2023-01-29 DIAGNOSIS — E785 Hyperlipidemia, unspecified: Secondary | ICD-10-CM

## 2023-01-29 DIAGNOSIS — E559 Vitamin D deficiency, unspecified: Secondary | ICD-10-CM

## 2023-03-13 ENCOUNTER — Ambulatory Visit
Admission: RE | Admit: 2023-03-13 | Discharge: 2023-03-13 | Disposition: A | Discharge status: Home / Self Care | Payer: 59 | Source: Ambulatory Visit | Attending: Internal Medicine | Admitting: Internal Medicine

## 2023-03-13 DIAGNOSIS — N6489 Other specified disorders of breast: Secondary | ICD-10-CM | POA: Insufficient documentation

## 2023-03-13 HISTORY — DX: Personal history of irradiation: Z92.3

## 2023-03-26 ENCOUNTER — Telehealth: Payer: Self-pay | Admitting: Family Medicine

## 2023-03-26 DIAGNOSIS — N6489 Other specified disorders of breast: Secondary | ICD-10-CM

## 2023-03-26 NOTE — Telephone Encounter (Signed)
Copied from CRM [id number]. Topic: General - Other >> Mar 26, 2023 12:47 PM Epimenio Foot F wrote: Reason for CRM: Pt is calling in requesting for Asher Muir to give her a call. Pt says it's not an urgent matter but she does have a question about something important.

## 2023-04-15 ENCOUNTER — Ambulatory Visit: Payer: 59 | Attending: Radiation Oncology | Admitting: Radiation Oncology

## 2023-04-26 ENCOUNTER — Other Ambulatory Visit: Payer: Self-pay | Admitting: Family Medicine

## 2023-04-26 DIAGNOSIS — E785 Hyperlipidemia, unspecified: Secondary | ICD-10-CM

## 2023-04-27 NOTE — Telephone Encounter (Signed)
Needs appt

## 2023-04-27 NOTE — Telephone Encounter (Signed)
Lvm for pt to call back and schedule an appt.

## 2023-05-25 ENCOUNTER — Other Ambulatory Visit: Payer: Self-pay | Admitting: Family Medicine

## 2023-05-25 DIAGNOSIS — E785 Hyperlipidemia, unspecified: Secondary | ICD-10-CM

## 2023-05-29 ENCOUNTER — Other Ambulatory Visit: Payer: Self-pay | Admitting: Family Medicine

## 2023-05-29 DIAGNOSIS — E785 Hyperlipidemia, unspecified: Secondary | ICD-10-CM

## 2023-07-07 ENCOUNTER — Other Ambulatory Visit: Payer: Self-pay | Admitting: Internal Medicine

## 2023-08-10 DIAGNOSIS — Z1212 Encounter for screening for malignant neoplasm of rectum: Secondary | ICD-10-CM | POA: Diagnosis not present

## 2023-08-10 DIAGNOSIS — Z1211 Encounter for screening for malignant neoplasm of colon: Secondary | ICD-10-CM | POA: Diagnosis not present

## 2023-08-15 ENCOUNTER — Ambulatory Visit
Admission: EM | Admit: 2023-08-15 | Discharge: 2023-08-15 | Disposition: A | Discharge status: Home / Self Care | Attending: Physician Assistant | Admitting: Physician Assistant

## 2023-08-15 ENCOUNTER — Encounter: Payer: Self-pay | Admitting: Emergency Medicine

## 2023-08-15 DIAGNOSIS — J40 Bronchitis, not specified as acute or chronic: Secondary | ICD-10-CM | POA: Diagnosis not present

## 2023-08-15 DIAGNOSIS — J329 Chronic sinusitis, unspecified: Secondary | ICD-10-CM | POA: Diagnosis not present

## 2023-08-15 DIAGNOSIS — R051 Acute cough: Secondary | ICD-10-CM | POA: Diagnosis not present

## 2023-08-15 LAB — COLOGUARD
COLOGUARD: NEGATIVE
Cologuard: NEGATIVE

## 2023-08-15 LAB — EXTERNAL GENERIC LAB PROCEDURE: COLOGUARD: NEGATIVE

## 2023-08-15 MED ORDER — AMOXICILLIN-POT CLAVULANATE 875-125 MG PO TABS: 1.0000 | ORAL_TABLET | Freq: Two times a day (BID) | ORAL | 0 refills | Status: DC

## 2023-08-15 NOTE — ED Provider Notes (Signed)
 [facility]    CSN: [id number] Arrival date & time: 08/15/23  1157      History   Chief Complaint Chief Complaint  Patient presents with   Cough    HPI Cheryl [name] is a 73 y.o. female.   Patient presents today with a 10-day history of URI symptoms that have worsened in the past 48 hours.  She reports initially she had subjective fever, cough, congestion, few episodes of emesis.  Her symptoms improved and she was feeling much better until a few days ago where her cough worsens.  She also reports a ongoing sinus pressure and congestion.  She denies any persistent fevers or associated chest pain, shortness of breath, nausea, vomiting.  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  She denies any recent antibiotics or steroids.  Denies history of allergies, asthma, COPD.  She does not smoke.  She is having difficulty with daily duties including sleeping at night as a result of symptoms.    Past Medical History:  Diagnosis Date   Anxiety    Breast cancer (HCC) 07/2020   right breast ILC   Hyperlipidemia    Osteoporosis    Personal history of radiation therapy     Patient Active Problem List   Diagnosis Date Noted   History of breast cancer 02/11/2022   Malignant neoplasm of upper-outer quadrant of right breast in female, estrogen receptor positive (HCC) 08/15/2020   Hyperlipidemia 06/21/2019   Vitamin D deficiency 06/21/2019   Hematuria 06/21/2019   Family history of breast cancer in first degree relative 06/21/2019   Osteoporosis without current pathological fracture 06/17/2019   Left shoulder pain 06/17/2019    Past Surgical History:  Procedure Laterality Date   BREAST BIOPSY Right 08/08/2020   U/S bx- GRADE II INVASIVE MAMMARY CARCINOMA   BREAST LUMPECTOMY Right 09/25/2020   Invasive lobular carcinoma   BREAST LUMPECTOMY WITH RADIOACTIVE SEED AND SENTINEL LYMPH NODE  BIOPSY Right 09/25/2020   Procedure: RIGHT BREAST LUMPECTOMY WITH RADIOACTIVE SEED AND RIGHT SENTINEL LYMPH NODE BIOPSY;  Surgeon: Harriette Bouillon, MD;  Location: [facility];  Service: General;  Laterality: Right;    OB History     Gravida  1   Para  1   Term  1   Preterm      AB      Living  1      SAB      IAB      Ectopic      Multiple      Live Births               Home Medications    Prior to Admission medications   Medication Sig Start Date End Date Taking? Authorizing Provider  alendronate (FOSAMAX) 70 MG tablet TAKE 1 TABLET BY MOUTH ONCE A WEEK. TAKE WITH A FULL GLASS OF WATER ON AN EMPTY STOMACH. 07/08/23  Yes Earna Coder, MD  amoxicillin-clavulanate (AUGMENTIN) 875-125 MG tablet Take 1 tablet by mouth every 12 (twelve) hours. 08/15/23  Yes Addalie Calles, Noberto Retort, PA-C    Family History Family History  Problem Relation Age of Onset   Breast cancer Mother 25   Breast cancer Maternal Aunt 27   Breast cancer Maternal Aunt 70   Throat cancer Father    Stroke Brother     Social History Social History   Tobacco Use   Smoking status: Never  Smokeless tobacco: Never  Vaping Use   Vaping status: Never Used  Substance Use Topics   Alcohol use: Never   Drug use: Never     Allergies   Patient has no known allergies.   Review of Systems Review of Systems  Constitutional:  Positive for activity change. Negative for appetite change, fatigue and fever.  HENT:  Positive for congestion, postnasal drip and sinus pressure. Negative for sneezing and sore throat.   Respiratory:  Positive for cough. Negative for shortness of breath.   Cardiovascular:  Negative for chest pain.  Gastrointestinal:  Negative for abdominal pain, diarrhea, nausea and vomiting.  Neurological:  Negative for dizziness, light-headedness and headaches.     Physical Exam Triage Vital Signs ED Triage Vitals  Encounter Vitals Group     BP 08/15/23 1333 130/70      Systolic BP Percentile --      Diastolic BP Percentile --      Pulse Rate 08/15/23 1333 83     Resp 08/15/23 1333 14     Temp 08/15/23 1333 98.1 F (36.7 C)     Temp Source 08/15/23 1333 Oral     SpO2 08/15/23 1333 100 %     Weight 08/15/23 1332 138 lb 14.2 oz (63 kg)     Height 08/15/23 1332 5\' 7"  (1.702 m)     Head Circumference --      Peak Flow --      Pain Score 08/15/23 1332 0     Pain Loc --      Pain Education --      Exclude from Growth Chart --    No data found.  Updated Vital Signs BP 130/70 (BP Location: Left Arm)   Pulse 83   Temp 98.1 F (36.7 C) (Oral)   Resp 14   Ht 5\' 7"  (1.702 m)   Wt 138 lb 14.2 oz (63 kg)   SpO2 100%   BMI 21.75 kg/m   Visual Acuity Right Eye Distance:   Left Eye Distance:   Bilateral Distance:    Right Eye Near:   Left Eye Near:    Bilateral Near:     Physical Exam Vitals reviewed.  Constitutional:      General: She is awake. She is not in acute distress.    Appearance: Normal appearance. She is well-developed. She is not ill-appearing.     Comments: Very pleasant female appears stated age in no acute distress sitting comfortably in exam room  HENT:     Head: Normocephalic and atraumatic.     Right Ear: Tympanic membrane, ear canal and external ear normal. Tympanic membrane is not erythematous or bulging.     Left Ear: Tympanic membrane, ear canal and external ear normal. Tympanic membrane is not erythematous or bulging.     Nose:     Right Sinus: Maxillary sinus tenderness present. No frontal sinus tenderness.     Left Sinus: Maxillary sinus tenderness present. No frontal sinus tenderness.     Mouth/Throat:     Pharynx: Uvula midline. Postnasal drip present. No oropharyngeal exudate or posterior oropharyngeal erythema.  Cardiovascular:     Rate and Rhythm: Normal rate and regular rhythm.     Heart sounds: Normal heart sounds, S1 normal and S2 normal. No murmur heard. Pulmonary:     Effort: Pulmonary effort is  normal.     Breath sounds: Normal breath sounds. No wheezing, rhonchi or rales.     Comments: Clear to auscultation bilaterally Psychiatric:  Behavior: Behavior is cooperative.      UC Treatments / Results  Labs (all labs ordered are listed, but only abnormal results are displayed) Labs Reviewed - No data to display  EKG   Radiology No results found.  Procedures Procedures (including critical care time)  Medications Ordered in UC Medications - No data to display  Initial Impression / Assessment and Plan / UC Course  I have reviewed the triage vital signs and the nursing notes.  Pertinent labs & imaging results that were available during my care of the patient were reviewed by me and considered in my medical decision making (see chart for details).     Patient is well-appearing, afebrile, nontoxic, nontachycardic.  No indication for viral testing as patient has been symptomatic for over a week and this would not change her management.  Given prolonged and recent double worsening symptoms concern for secondary bacterial infection.  Will start Augmentin twice daily for 7 days.  No indication for dose adjustment based on metabolic panel from 02/11/2022 with creatinine of 1.11 and calculated creatinine clearance of 46.6 mL/min.  She was encouraged to use over-the-counter medication for additional symptom relief including Mucinex, Flonase, nasal saline/sinus rinses, Tylenol.  Chest x-ray was deferred as she no adventitious lung sounds on exam and her oxygen saturation was 100%.  We discussed that if her symptoms are not improving within 3 to 5 days or if anything worsen she needs to be seen immediately.  Strict return precautions given.    Final Clinical Impressions(s) / UC Diagnoses   Final diagnoses:  Sinobronchitis  Acute cough     Discharge Instructions      We are treating you for a sinus/bronchitis infection.  Start Augmentin twice daily.  Use over-the-counter  medication including Mucinex, Flonase, nasal saline/sinus rinses for additional symptom relief.  If your symptoms are not improving within 3 to 5 days or if anything worsens and you have high fever, worsening cough, shortness of breath, chest pain, nausea/vomiting you need to be seen immediately.     ED Prescriptions     Medication Sig Dispense Auth. Provider   amoxicillin-clavulanate (AUGMENTIN) 875-125 MG tablet Take 1 tablet by mouth every 12 (twelve) hours. 14 tablet Alexica Schlossberg, Noberto Retort, PA-C      PDMP not reviewed this encounter.   Jeani Hawking, PA-C 08/15/23 1405

## 2023-08-15 NOTE — Discharge Instructions (Signed)
 We are treating you for a sinus/bronchitis infection.  Start Augmentin twice daily.  Use over-the-counter medication including Mucinex, Flonase, nasal saline/sinus rinses for additional symptom relief.  If your symptoms are not improving within 3 to 5 days or if anything worsens and you have high fever, worsening cough, shortness of breath, chest pain, nausea/vomiting you need to be seen immediately.

## 2023-08-15 NOTE — ED Triage Notes (Signed)
 Patient reports ongoing cough and chest congestion for a week.  Patient reports coughing up sputum over the past 2-3 days.  Patient denies fevers.

## 2023-08-17 ENCOUNTER — Encounter: Payer: Self-pay | Admitting: Family Medicine

## 2023-09-14 ENCOUNTER — Inpatient Hospital Stay: Admission: RE | Admit: 2023-09-14 | Payer: 59 | Source: Ambulatory Visit

## 2023-09-29 ENCOUNTER — Encounter

## 2024-03-03 ENCOUNTER — Other Ambulatory Visit: Payer: Self-pay | Admitting: Family Medicine

## 2024-03-03 ENCOUNTER — Encounter: Payer: Self-pay | Admitting: Internal Medicine

## 2024-03-03 DIAGNOSIS — N6489 Other specified disorders of breast: Secondary | ICD-10-CM

## 2024-03-03 DIAGNOSIS — E785 Hyperlipidemia, unspecified: Secondary | ICD-10-CM

## 2024-03-04 NOTE — Telephone Encounter (Signed)
 Requested medications are due for refill today.  unsure  Requested medications are on the active medications list.  no  Last refill. unsure  Future visit scheduled.   no  Notes to clinic.  Medication was d/c'd by UC provider.     Requested Prescriptions  Pending Prescriptions Disp Refills   rosuvastatin  (CRESTOR ) 10 MG tablet [Pharmacy Med Name: ROSUVASTATIN  CALCIUM  10 MG TAB] 30 tablet 0    Sig: TAKE 1 TABLET BY MOUTH EVERY DAY     Cardiovascular:  Antilipid - Statins 2 Failed - 03/04/2024  1:15 PM      Failed - Cr in normal range and within 360 days    Creat  Date Value Ref Range Status  02/11/2022 1.11 (H) 0.50 - 1.05 mg/dL Final         Failed - Valid encounter within last 12 months    Recent Outpatient Visits   None            Failed - Lipid Panel in normal range within the last 12 months    Cholesterol  Date Value Ref Range Status  02/11/2022 169 <200 mg/dL Final   LDL Cholesterol (Calc)  Date Value Ref Range Status  02/11/2022 86 mg/dL (calc) Final    Comment:    Reference range: <100 . Desirable range <100 mg/dL for primary prevention;   <70 mg/dL for patients with CHD or diabetic patients  with > or = 2 CHD risk factors. SABRA LDL-C is now calculated using the Martin-Hopkins  calculation, which is a validated novel method providing  better accuracy than the Friedewald equation in the  estimation of LDL-C.  Gladis APPLETHWAITE et al. SANDREA. 7986;689(80): 2061-2068  (http://education.[employer].com/faq/FAQ164)    HDL  Date Value Ref Range Status  02/11/2022 66 > OR = 50 mg/dL Final   Triglycerides  Date Value Ref Range Status  02/11/2022 80 <150 mg/dL Final         Passed - Patient is not pregnant

## 2024-03-08 ENCOUNTER — Encounter: Payer: Self-pay | Admitting: Nurse Practitioner

## 2024-03-08 ENCOUNTER — Ambulatory Visit (INDEPENDENT_AMBULATORY_CARE_PROVIDER_SITE_OTHER): Admitting: Nurse Practitioner

## 2024-03-08 VITALS — BP 126/80 | HR 81 | Temp 97.9°F | Resp 18 | Ht 67.0 in | Wt 142.8 lb

## 2024-03-08 DIAGNOSIS — Z130 Encounter for screening for diseases of the blood and blood-forming organs and certain disorders involving the immune mechanism: Secondary | ICD-10-CM | POA: Diagnosis not present

## 2024-03-08 DIAGNOSIS — Z131 Encounter for screening for diabetes mellitus: Secondary | ICD-10-CM | POA: Diagnosis not present

## 2024-03-08 DIAGNOSIS — Z853 Personal history of malignant neoplasm of breast: Secondary | ICD-10-CM

## 2024-03-08 DIAGNOSIS — M810 Age-related osteoporosis without current pathological fracture: Secondary | ICD-10-CM

## 2024-03-08 DIAGNOSIS — H60312 Diffuse otitis externa, left ear: Secondary | ICD-10-CM | POA: Diagnosis not present

## 2024-03-08 DIAGNOSIS — E559 Vitamin D deficiency, unspecified: Secondary | ICD-10-CM

## 2024-03-08 DIAGNOSIS — E785 Hyperlipidemia, unspecified: Secondary | ICD-10-CM | POA: Diagnosis not present

## 2024-03-08 LAB — LIPID PANEL
Cholesterol: 152 mg/dL (ref <200)
HDL: 57 mg/dL (ref >=50)
LDL Cholesterol (Calc): 80 mg/dL
Non-HDL Cholesterol (Calc): 95 mg/dL (ref <130)
Total CHOL/HDL Ratio: 2.7 (calc) (ref <5.0)
Triglycerides: 69 mg/dL (ref <150)

## 2024-03-08 LAB — VITAMIN D 25 HYDROXY (VIT D DEFICIENCY, FRACTURES): Vit D, 25-Hydroxy: 41 ng/mL (ref 30–100)

## 2024-03-08 LAB — CBC WITH DIFFERENTIAL/PLATELET
Absolute Lymphocytes: 1675 {cells}/uL (ref 850–3900)
Absolute Monocytes: 557 {cells}/uL (ref 200–950)
Basophils Absolute: 62 {cells}/uL (ref 0–200)
Basophils Relative: 1.3 %
Eosinophils Absolute: 53 {cells}/uL (ref 15–500)
Eosinophils Relative: 1.1 %
HCT: 36.3 % (ref 35.0–45.0)
Hemoglobin: 11.6 g/dL — ABNORMAL LOW (ref 11.7–15.5)
MCH: 28.8 pg (ref 27.0–33.0)
MCHC: 32 g/dL (ref 32.0–36.0)
MCV: 90.1 fL (ref 80.0–100.0)
MPV: 9.3 fL (ref 7.5–12.5)
Monocytes Relative: 11.6 %
Neutro Abs: 2453 {cells}/uL (ref 1500–7800)
Neutrophils Relative %: 51.1 %
Platelets: 239 Thousand/uL (ref 140–400)
RBC: 4.03 Million/uL (ref 3.80–5.10)
RDW: 14.1 % (ref 11.0–15.0)
Total Lymphocyte: 34.9 %
WBC: 4.8 Thousand/uL (ref 3.8–10.8)

## 2024-03-08 LAB — COMPREHENSIVE METABOLIC PANEL WITH GFR
AG Ratio: 1.2 (calc) (ref 1.0–2.5)
ALT: 11 U/L (ref 6–29)
AST: 14 U/L (ref 10–35)
Albumin: 4.1 g/dL (ref 3.6–5.1)
Alkaline phosphatase (APISO): 66 U/L (ref 37–153)
BUN: 17 mg/dL (ref 7–25)
CO2: 27 mmol/L (ref 20–32)
Calcium: 9.9 mg/dL (ref 8.6–10.4)
Chloride: 106 mmol/L (ref 98–110)
Creat: 0.91 mg/dL (ref 0.60–1.00)
Globulin: 3.5 g/dL (ref 1.9–3.7)
Glucose, Bld: 103 mg/dL — ABNORMAL HIGH (ref 65–99)
Potassium: 5.3 mmol/L (ref 3.5–5.3)
Sodium: 138 mmol/L (ref 135–146)
Total Bilirubin: 0.3 mg/dL (ref 0.2–1.2)
Total Protein: 7.6 g/dL (ref 6.1–8.1)
eGFR: 67 mL/min/1.73m2 (ref >=60)

## 2024-03-08 MED ORDER — ROSUVASTATIN CALCIUM 10 MG PO TABS
10.0000 mg | ORAL_TABLET | Freq: Every day | ORAL | 3 refills | Status: AC
Start: 2024-03-08 — End: ?

## 2024-03-08 MED ORDER — NEOMYCIN-POLYMYXIN-HC 1 % OT SOLN
3.0000 [drp] | OTIC | 0 refills | Status: AC
Start: 2024-03-08 — End: ?

## 2024-03-08 NOTE — Progress Notes (Signed)
 BP 126/80   Pulse 81   Temp 97.9 F (36.6 C)   Resp 18   Ht 5' 7 (1.702 m)   Wt 142 lb 12.8 oz (64.8 kg)   SpO2 96%   BMI 22.37 kg/m    Subjective:    Patient ID: Cheryl [name], female    DOB: [birth year], 73 y.o.   MRN: [id number]  HPI: Cheryl [name] is a 73 y.o. female  Chief Complaint  Patient presents with   Medication Refill   Ear Pain    left   Discussed the use of AI scribe software for clinical note transcription with the patient, who gave verbal consent to proceed.  History of Present Illness Cheryl [name] is a 73 year old female who presents for medication refill and evaluation of ear pain.  Otalgia and tinnitus - Soreness in the left ear for approximately one month - Associated with ringing in the left ear - No mention of drainage, hearing loss, or fever - Daughter concerned about possible infection  Breast cancer surveillance - History of breast cancer diagnosed approximately two years ago - Previously on a follow-up schedule every three months - Missed last follow-up appointment due to transportation issues - Due for a mammogram this month  Osteoporosis management - Osteoporosis treated with Fosamax  70 mg weekly for approximately four years - Overdue for DEXA scan to assess treatment effectiveness  Hyperlipidemia management - Previously taking rosuvastatin  10 mg daily - Requesting medication refill - Last lipid panel in 2023 was within normal range - Overdue for laboratory evaluation  Vitamin d  deficiency - History of vitamin D  deficiency - Vitamin D  levels were normal at last check         03/08/2024   10:10 AM 07/24/2022   11:40 AM 02/11/2022   10:41 AM  Depression screen PHQ 2/9  Decreased Interest 0 0 0  Down, Depressed, Hopeless 0 0 0  PHQ - 2 Score 0 0 0  Altered sleeping 0 0 0  Tired, decreased energy 0 0 0  Change in appetite 0 0 0  Feeling bad or failure about yourself  0 0 0  Trouble concentrating 0 0 0  Moving  slowly or fidgety/restless 0 0 0  Suicidal thoughts 0 0 0  PHQ-9 Score 0 0 0  Difficult doing work/chores Not difficult at all Not difficult at all Not difficult at all    Relevant past medical, surgical, family and social history reviewed and updated as indicated. Interim medical history since our last visit reviewed. Allergies and medications reviewed and updated.  Review of Systems  Constitutional: Negative for fever or weight change.  Respiratory: Negative for cough and shortness of breath.   Cardiovascular: Negative for chest pain or palpitations.  Gastrointestinal: Negative for abdominal pain, no bowel changes.  Musculoskeletal: Negative for gait problem or joint swelling.  Skin: Negative for rash.  Neurological: Negative for dizziness or headache.  No other specific complaints in a complete review of systems (except as listed in HPI above).      Objective:      BP 126/80   Pulse 81   Temp 97.9 F (36.6 C)   Resp 18   Ht 5' 7 (1.702 m)   Wt 142 lb 12.8 oz (64.8 kg)   SpO2 96%   BMI 22.37 kg/m    Wt Readings from Last 3 Encounters:  03/08/24 142 lb 12.8 oz (64.8 kg)  08/15/23 138 lb 14.2 oz (63 kg)  09/25/22  139 lb (63 kg)    Physical Exam GENERAL: Alert, cooperative, well developed, no acute distress. HEENT: Normocephalic, normal oropharynx, moist mucous membranes. Left ear canal appears infected. TM intact  CHEST: Clear to auscultation bilaterally, no wheezes, rhonchi, or crackles. CARDIOVASCULAR: Normal heart rate and rhythm, S1 and S2 normal without murmurs. ABDOMEN: Soft, non-tender, non-distended, without organomegaly, normal bowel sounds. EXTREMITIES: No cyanosis or edema. NEUROLOGICAL: Cranial nerves grossly intact, moves all extremities without gross motor or sensory deficit.  Results for orders placed or performed in visit on 08/17/23  Cologuard   Collection Time: 08/15/23  8:13 AM  Result Value Ref Range   Cologuard Negative Negative           Assessment & Plan:   Problem List Items Addressed This Visit       Musculoskeletal and Integument   Osteoporosis without current pathological fracture   Relevant Orders   HM DEXA SCAN (Completed)     Other   Hyperlipidemia   Relevant Medications   rosuvastatin  (CRESTOR ) 10 MG tablet   rosuvastatin  (CRESTOR ) 10 MG tablet   Other Relevant Orders   Comprehensive metabolic panel with GFR   Lipid panel   Vitamin D  deficiency   Relevant Orders   VITAMIN D  25 Hydroxy (Vit-D Deficiency, Fractures)   History of breast cancer - Primary   Relevant Orders   MM 3D DIAGNOSTIC MAMMOGRAM BILATERAL BREAST   US  LIMITED ULTRASOUND INCLUDING AXILLA LEFT BREAST    US  LIMITED ULTRASOUND INCLUDING AXILLA RIGHT BREAST   Other Visit Diagnoses       Screening for deficiency anemia       Relevant Orders   CBC with Differential/Platelet     Screening for diabetes mellitus       Relevant Orders   Comprehensive metabolic panel with GFR     Acute diffuse otitis externa of left ear       Relevant Medications   NEOMYCIN-POLYMYXIN-HYDROCORTISONE (CORTISPORIN) 1 % SOLN OTIC solution        Assessment and Plan Assessment & Plan Left otitis externa Soreness in the left ear canal for about a month, likely causing tinnitus. The infection is localized to the ear canal, not involving the middle ear. - Prescribe ear drops to treat the infection.  Osteoporosis Currently on Fosamax  70 mg weekly for approximately 40 years, exceeding the typical five-year duration if effective. Due for a DEXA scan to evaluate the effectiveness of Fosamax . - Order DEXA scan to assess bone density and effectiveness of Fosamax .  Hyperlipidemia Previously on rosuvastatin  10 mg daily. Last lipid panel in 2023 was normal. Overdue for labs to reassess lipid levels. - Refill rosuvastatin  10 mg daily. - Order lipid panel to reassess cholesterol levels.  History of malignant neoplasm of right breast Breast cancer two years  ago. Missed last oncology follow-up due to transportation issues. Due for a mammogram. - Order mammogram.  Vitamin D  deficiency Previously normal vitamin D  levels. Overdue for labs to reassess vitamin D  levels. - Order vitamin D  level test.  General Health Maintenance Overdue for routine health screenings and labs. - Order routine labs.        Follow up plan: Return in about 6 months (around 09/06/2024) for follow up.

## 2024-03-09 ENCOUNTER — Other Ambulatory Visit

## 2024-03-09 ENCOUNTER — Inpatient Hospital Stay: Admission: RE | Admit: 2024-03-09 | Source: Ambulatory Visit

## 2024-03-09 ENCOUNTER — Ambulatory Visit: Payer: Self-pay | Admitting: Nurse Practitioner

## 2024-03-14 ENCOUNTER — Ambulatory Visit
Admission: RE | Admit: 2024-03-14 | Discharge: 2024-03-14 | Disposition: A | Discharge status: Home / Self Care | Source: Ambulatory Visit | Attending: Nurse Practitioner | Admitting: Nurse Practitioner

## 2024-03-14 ENCOUNTER — Telehealth: Payer: Self-pay

## 2024-03-14 DIAGNOSIS — Z853 Personal history of malignant neoplasm of breast: Secondary | ICD-10-CM | POA: Insufficient documentation

## 2024-03-14 DIAGNOSIS — R92333 Mammographic heterogeneous density, bilateral breasts: Secondary | ICD-10-CM | POA: Diagnosis not present

## 2024-03-14 DIAGNOSIS — N644 Mastodynia: Secondary | ICD-10-CM | POA: Diagnosis not present

## 2024-03-14 NOTE — Telephone Encounter (Signed)
 Pt.notified

## 2024-03-14 NOTE — Telephone Encounter (Signed)
 Pt would like her lab results
